# Patient Record
Sex: Male | Born: 1975 | Race: White | Hispanic: No | Marital: Married | Smoking: Never smoker
Health system: Southern US, Community
[De-identification: ages and names within clinical notes are randomized; demographics above are authoritative.]

## PROBLEM LIST (undated history)

## (undated) DIAGNOSIS — K219 Gastro-esophageal reflux disease without esophagitis: Secondary | ICD-10-CM

## (undated) DIAGNOSIS — L309 Dermatitis, unspecified: Secondary | ICD-10-CM

## (undated) HISTORY — DX: Dermatitis, unspecified: L30.9

## (undated) HISTORY — DX: Gastro-esophageal reflux disease without esophagitis: K21.9

## (undated) HISTORY — PX: WISDOM TOOTH EXTRACTION: SHX21

## (undated) HISTORY — PX: LIPOMA EXCISION: SHX5283

---

## 2015-11-25 ENCOUNTER — Ambulatory Visit
Admission: RE | Admit: 2015-11-25 | Discharge: 2015-11-25 | Disposition: A | Discharge status: Home / Self Care | Payer: Self-pay | Source: Ambulatory Visit | Attending: Sports Medicine | Admitting: Sports Medicine

## 2015-11-25 ENCOUNTER — Other Ambulatory Visit: Payer: Self-pay | Admitting: *Deleted

## 2015-11-25 DIAGNOSIS — R0781 Pleurodynia: Secondary | ICD-10-CM

## 2015-11-25 MED ORDER — IBUPROFEN 800 MG PO TABS
ORAL_TABLET | ORAL | Status: DC
Start: 2015-11-25 — End: 2019-02-23

## 2015-11-28 ENCOUNTER — Telehealth: Payer: Self-pay | Admitting: Sports Medicine
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
## 2016-09-07 ENCOUNTER — Other Ambulatory Visit: Payer: Self-pay | Admitting: Family Medicine

## 2016-09-07 DIAGNOSIS — R221 Localized swelling, mass and lump, neck: Secondary | ICD-10-CM

## 2016-09-20 ENCOUNTER — Ambulatory Visit
Admission: RE | Admit: 2016-09-20 | Discharge: 2016-09-20 | Disposition: A | Discharge status: Home / Self Care | Payer: BLUE CROSS/BLUE SHIELD | Source: Ambulatory Visit | Attending: Family Medicine | Admitting: Family Medicine

## 2016-09-20 DIAGNOSIS — R221 Localized swelling, mass and lump, neck: Secondary | ICD-10-CM

## 2016-09-20 MED ORDER — IOPAMIDOL (ISOVUE-300) INJECTION 61%: 75.0000 mL | Freq: Once | INTRAVENOUS | Status: DC | PRN

## 2016-12-05 ENCOUNTER — Ambulatory Visit (HOSPITAL_COMMUNITY)
Admission: EM | Admit: 2016-12-05 | Discharge: 2016-12-05 | Disposition: A | Discharge status: Home / Self Care | Payer: BLUE CROSS/BLUE SHIELD

## 2016-12-07 ENCOUNTER — Other Ambulatory Visit: Payer: Self-pay | Admitting: Family Medicine

## 2016-12-07 ENCOUNTER — Ambulatory Visit
Admission: RE | Admit: 2016-12-07 | Discharge: 2016-12-07 | Disposition: A | Discharge status: Home / Self Care | Payer: BLUE CROSS/BLUE SHIELD | Source: Ambulatory Visit | Attending: Family Medicine | Admitting: Family Medicine

## 2016-12-07 DIAGNOSIS — N23 Unspecified renal colic: Secondary | ICD-10-CM

## 2017-04-15 ENCOUNTER — Telehealth: Payer: Self-pay | Admitting: Sports Medicine

## 2017-04-15 ENCOUNTER — Other Ambulatory Visit: Payer: Self-pay | Admitting: *Deleted

## 2017-04-15 DIAGNOSIS — R599 Enlarged lymph nodes, unspecified: Secondary | ICD-10-CM

## 2017-04-15 NOTE — Telephone Encounter (Signed)
Please note that this is not a telephone note.  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 He is complaining of a lump on the right side of his neck. It has been present for several months. It is nonpainful. It has decreased some in size but is still noticeable. He denies fevers or chills. No weight loss. A CT scan with and without contrast done in January of this year showed multiple cervical lymph nodes bilaterally. No further workup or follow-up was recommended by the ordering physician per the patient's report.  Physical examination shows him to have a palpable immobile mass along the right side of his neck. It is nontender. No other masses are appreciated.  Impression: Cervical lymphadenopathy  Plan: This lymph node has been present for several months. Patient is concerned and would like to see a specialist. I recommend consultation with Dr. Narda Bonds. I'll defer further workup and treatment to the discretion of Dr. Ezzard Standing.

## 2017-04-15 NOTE — Progress Notes (Signed)
Patient ID: Shawn Fuentes, male   DOB: [birth year], 42 y.o.   MRN: [id number]  Appt with:   Dr Narda Bonds [address] [place] [place]  Phone: [phone]  Thursday 04/18/17 at 215p

## 2017-10-23 ENCOUNTER — Encounter: Payer: Self-pay | Admitting: *Deleted

## 2017-10-23 NOTE — Progress Notes (Signed)
Dr Narda Bondshris Newman Fri 10/25/17 at 215pm [address],  [place], [place] [address] [phone]

## 2017-12-05 ENCOUNTER — Encounter: Payer: Self-pay | Admitting: Sports Medicine

## 2017-12-05 ENCOUNTER — Ambulatory Visit (INDEPENDENT_AMBULATORY_CARE_PROVIDER_SITE_OTHER): Payer: BLUE CROSS/BLUE SHIELD | Admitting: Sports Medicine

## 2017-12-05 VITALS — BP 138/90 | Ht 68.0 in | Wt 232.0 lb

## 2017-12-05 DIAGNOSIS — R599 Enlarged lymph nodes, unspecified: Secondary | ICD-10-CM | POA: Diagnosis not present

## 2017-12-05 NOTE — Progress Notes (Signed)
   Subjective:    Patient ID: Shawn Fuentes, male    DOB: April 26, 1976, 42 y.o.   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 This has been ongoing for several months. He recently saw Dr. Ezzard Standing for concerns about a lump on the right side of his neck. Dr. Ezzard Standing reassured him that it was not cancerous but that he would be happy to surgically excise it if it was bothersome. Patient is planning on having that excised on April 25. His pain is mild and noticeable primarily with direct pressure over the palpable lump as well as with cervical rotation to the left. No fevers or chills. He brought some blood work results with him that are dated 02/13/2017. He would like to establish me as his primary care physician from this point going forward.  No significant past medical history other than a single episode of a renal calculus in May of last year He takes no chronic medications No known drug allergies Surgical history is significant for a recent basal cell carcinoma removal from his upper lip. He denies tobacco use, drinks alcohol on occasion Family history is negative for coronary artery disease or cancer. Both of his parents are alive and healthy.    Review of Systems As above    Objective:   Physical Exam  Well-developed, well-nourished. No acute distress. Awake alert and oriented 3. Vital signs reviewed. Blood pressure is 138/90  Neck: Small palpable mass along the right lateral neck. Minimally tender to palpation. It is somewhat mobile. No surrounding edema.  Laboratory data from 02/13/2017 including a lipid panel, TSH, and glucose are all within normal limits. A CBC and CMP done on 12/10/2016 were also within normal limits.      Assessment & Plan:   Right-sided neck mass, likely a small lymph node  Patient has been reassured that this is likely not cancer. However, it is certainly causing  him some discomfort and distress. Therefore, I recommend that he proceed with surgical excision as scheduled. I will check some follow-up blood work including a CBC and CMP today. Phone follow-up with those results when available. Patient will return to the office in June for a complete physical exam.

## 2017-12-06 LAB — COMPREHENSIVE METABOLIC PANEL
ALK PHOS: 84 IU/L (ref 39–117)
ALT: 23 IU/L (ref 0–44)
AST: 19 IU/L (ref 0–40)
Albumin/Globulin Ratio: 1.8 (ref 1.2–2.2)
Albumin: 4.6 g/dL (ref 3.5–5.5)
BILIRUBIN TOTAL: 0.6 mg/dL (ref 0.0–1.2)
BUN / CREAT RATIO: 13 (ref 9–20)
BUN: 12 mg/dL (ref 6–24)
CO2: 22 mmol/L (ref 20–29)
Calcium: 9.2 mg/dL (ref 8.7–10.2)
Chloride: 105 mmol/L (ref 96–106)
Creatinine, Ser: 0.92 mg/dL (ref 0.76–1.27)
GFR calc Af Amer: 119 mL/min/{1.73_m2} (ref >59)
GFR calc non Af Amer: 103 mL/min/{1.73_m2} (ref >59)
GLUCOSE: 91 mg/dL (ref 65–99)
Globulin, Total: 2.5 g/dL (ref 1.5–4.5)
Potassium: 4.8 mmol/L (ref 3.5–5.2)
Sodium: 141 mmol/L (ref 134–144)
Total Protein: 7.1 g/dL (ref 6.0–8.5)

## 2017-12-06 LAB — CBC
Hematocrit: 47.8 % (ref 37.5–51.0)
Hemoglobin: 16.6 g/dL (ref 13.0–17.7)
MCH: 30.2 pg (ref 26.6–33.0)
MCHC: 34.7 g/dL (ref 31.5–35.7)
MCV: 87 fL (ref 79–97)
PLATELETS: 288 10*3/uL (ref 150–379)
RBC: 5.5 x10E6/uL (ref 4.14–5.80)
RDW: 14 % (ref 12.3–15.4)
WBC: 6 10*3/uL (ref 3.4–10.8)

## 2017-12-16 ENCOUNTER — Telehealth: Payer: Self-pay | Admitting: Sports Medicine

## 2017-12-16 NOTE — Telephone Encounter (Signed)
Patient notified last week via telephone of normal blood work.

## 2017-12-19 ENCOUNTER — Other Ambulatory Visit: Payer: Self-pay | Admitting: Otolaryngology

## 2018-02-13 ENCOUNTER — Encounter: Payer: Self-pay | Admitting: Sports Medicine

## 2018-02-13 ENCOUNTER — Ambulatory Visit (INDEPENDENT_AMBULATORY_CARE_PROVIDER_SITE_OTHER): Payer: BLUE CROSS/BLUE SHIELD | Admitting: Sports Medicine

## 2018-02-13 VITALS — BP 116/88 | Ht 68.0 in | Wt 232.0 lb

## 2018-02-13 DIAGNOSIS — Z0000 Encounter for general adult medical examination without abnormal findings: Secondary | ICD-10-CM

## 2018-02-13 NOTE — Progress Notes (Signed)
   Subjective:    Patient ID: Shawn Fuentes, male    DOB: 23-May-1976, 42 y.o.   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 Only complaint is intermittent choking. He states that over the past 2 years he has had several episodes where he feels like he cannot catch his breath when eating or drinking. Most episodes start with swallowing. For instance, he states that once while drinking wine he felt his throat close off and he could not catch his breath. Episode lasted about 30 seconds. That was 2 years ago. Since then, he will have episodes that occur at random. He states that he "feels like I'm choking but I can't get my breath". One episode occurred in the evening after eating a large meal. He had some burning in his throat shortly after that.He has recently started Pepcid [facility] for reflux but he isn't sure if that helps. Each episode is quite embarrassing for him. He looks like he is choking when it happens. He has a history of reflux and has been on Prilosec in the past.  He takes no chronic medications Has no known drug allergies Recently had a lipoma removed from his neck. Also recently had surgery on his upper lip to remove skin cancer. 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    Review of Systems Denies chest pain, abdominal pain, lower abdominal swelling. Remainder of review of systems is as above    Objective:   Physical Exam Well developed, well-nourished. No acute distress. Awake alert and oriented 3. Vital signs reviewed  HEENT: Well-healed surgical incision along the right side of his neck consistent with his previous lipoma removal. No carotid bruits. Cardiovascular: Regular rate, no murmurs, rubs, or gallops. Lungs: Clear to auscultation bilaterally, no rhonchi, rales, or wheezes. Abdomen: Benign Extremities: No edema. Good pulses       Assessment  & Plan:   Aclasia versus GERD versus vocal cord dysfunction  CBC and CMP recently were unremarkable. We will check a fasting lipid profile today. Patient will discontinue Pepcid and start over-the-counter Prilosec daily for the next eight weeks. However, if he experiences another episode, then I would consider an initial referral to GI since his symptoms seem to be triggered by swallowing. Follow-up with me in 8 weeks for recheck. I will call him with the results of his fasting lipid profile when available.

## 2018-02-14 LAB — LIPID PANEL
Chol/HDL Ratio: 5.1 ratio — ABNORMAL HIGH (ref 0.0–5.0)
Cholesterol, Total: 197 mg/dL (ref 100–199)
HDL: 39 mg/dL — ABNORMAL LOW (ref >39)
LDL Calculated: 134 mg/dL — ABNORMAL HIGH (ref 0–99)
Triglycerides: 119 mg/dL (ref 0–149)
VLDL Cholesterol Cal: 24 mg/dL (ref 5–40)

## 2018-02-14 NOTE — Patient Instructions (Signed)
[facility] Dr Ewing SchleinMagod [address], [address] [place] [address] Thursday 02/20/18 @ 345p Arrival time is 330p [phone]

## 2018-02-20 ENCOUNTER — Other Ambulatory Visit: Payer: Self-pay | Admitting: Gastroenterology

## 2018-02-20 DIAGNOSIS — R1310 Dysphagia, unspecified: Secondary | ICD-10-CM

## 2018-02-25 ENCOUNTER — Telehealth: Payer: Self-pay | Admitting: Sports Medicine

## 2018-02-25 NOTE — Telephone Encounter (Signed)
I left a message on the patient's voicemail today regarding his recent cholesterol. LDL is elevated at 134. I've recommended lifestyle changes consisting of improved diet and exercise. We will recheck his cholesterol in 6 months. I also decided to refer the patient to GI for his dysphasia/ questionable acalasia. I reviewed the notes from Dr.Magod who has recommended daily omeprazole and scheduled a barium swallow. If that study is unremarkable, then the patient may need to return to ENT to rule out laryngeal spasm.

## 2018-03-03 ENCOUNTER — Ambulatory Visit
Admission: RE | Admit: 2018-03-03 | Discharge: 2018-03-03 | Disposition: A | Discharge status: Home / Self Care | Payer: BLUE CROSS/BLUE SHIELD | Source: Ambulatory Visit | Attending: Gastroenterology | Admitting: Gastroenterology

## 2018-03-03 DIAGNOSIS — R1310 Dysphagia, unspecified: Secondary | ICD-10-CM

## 2018-08-12 IMAGING — RF DG ESOPHAGUS
10 of 11 series · 14 of 24 positions shown · non-contrast
Comparison: None.

CLINICAL DATA: Esophageal dysphagia

EXAM:
ESOPHOGRAM / BARIUM SWALLOW / BARIUM TABLET STUDY
TECHNIQUE: Combined double contrast and single contrast examination performed
using effervescent crystals, thick barium liquid, and thin barium
liquid. The patient was observed with fluoroscopy swallowing a 13 mm
barium sulphate tablet.
FLUOROSCOPY TIME:  Fluoroscopy Time:  1 minutes and 54 seconds
Radiation Exposure Index (if provided by the fluoroscopic device):
126 mGy
Number of Acquired Spot Images: 0

[Series 1: sequence · 0.31mm/px · 2 of 15 frames shown (1 of 8)]
[frame 3/15]
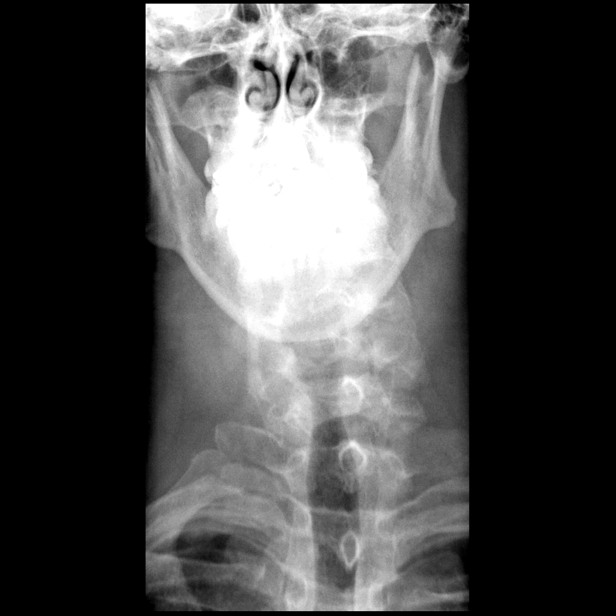
[frame 13/15]
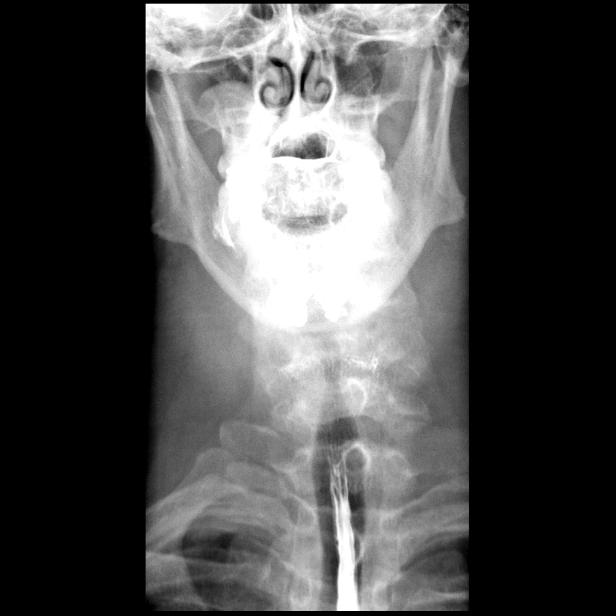

[Series 2: sequence · 0.31mm/px · 1 of 13 frames shown (2 of 8)]
[frame 11/13]
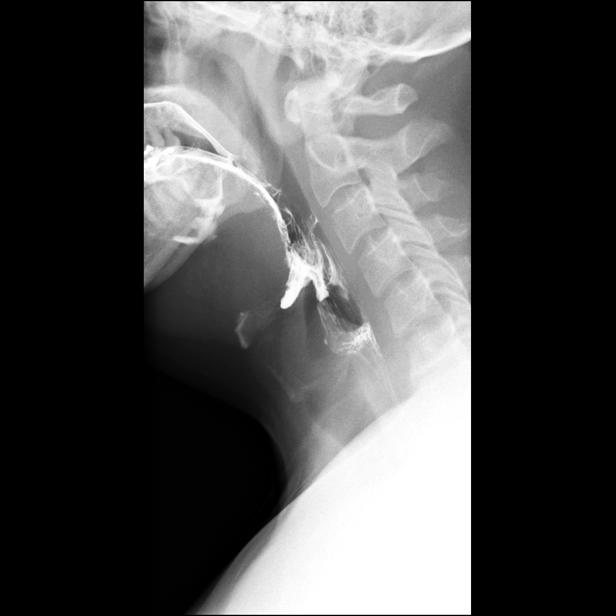

[Series 3: sequence · 0.28mm/px · 2 of 9 frames shown (3 of 8)]
[frame 5/9]
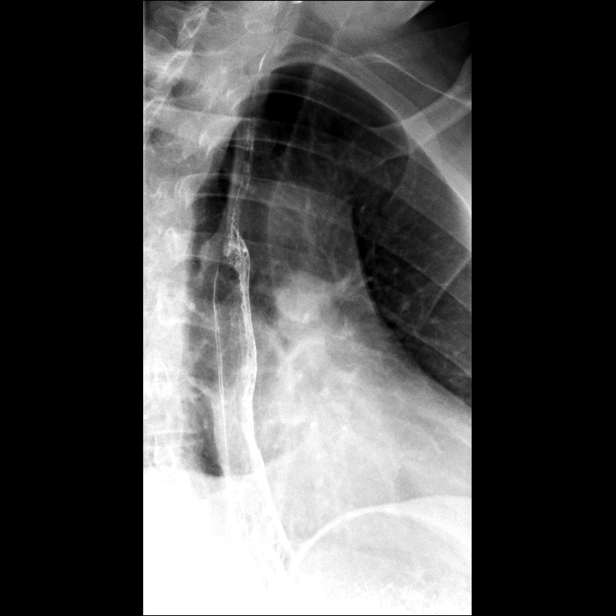
[frame 7/9]
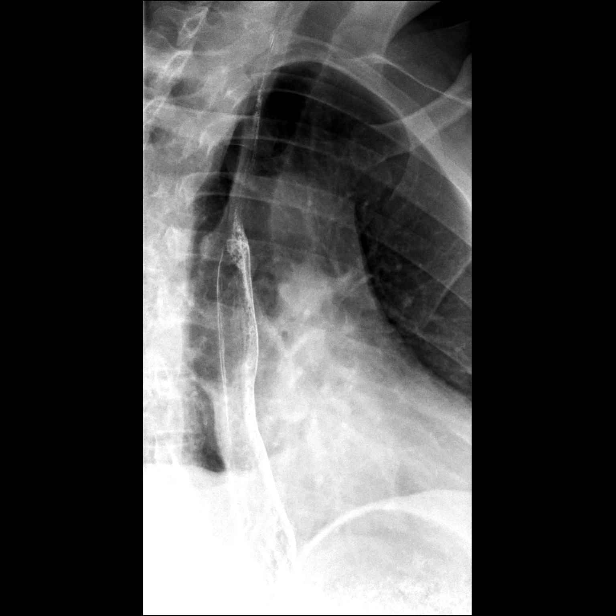

[Series 4: sequence · 1 of 9 frames shown (4 of 8)]
[frame 5/9]
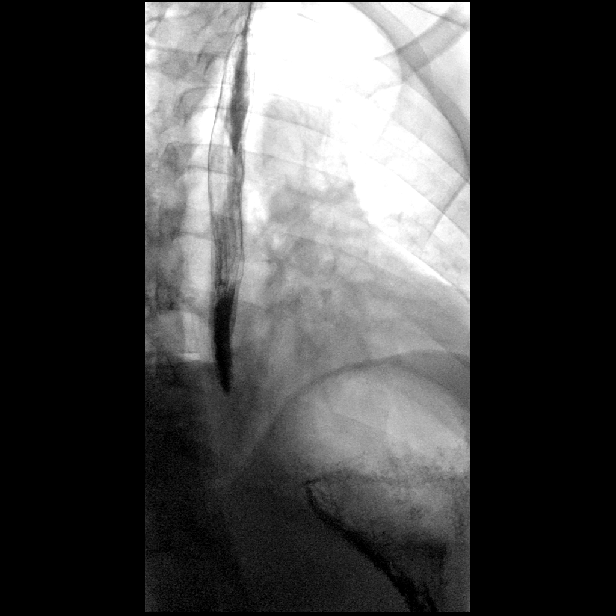

[Series 6: sequence · 2 of 14 frames shown (5 of 8)]
[frame 3/14]
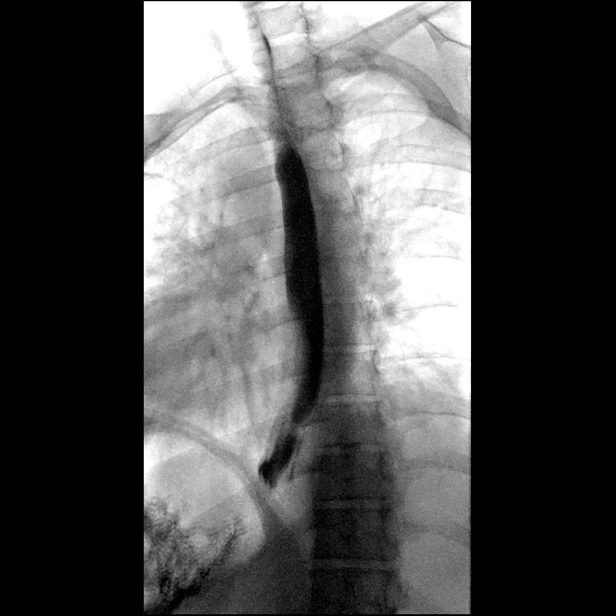
[frame 8/14]
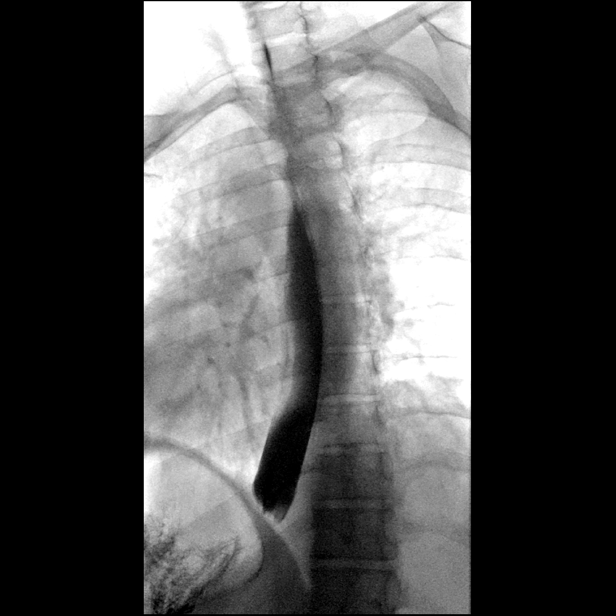

[Series 7: sequence · 1 of 19 frames shown (6 of 8)]
[frame 10/19]
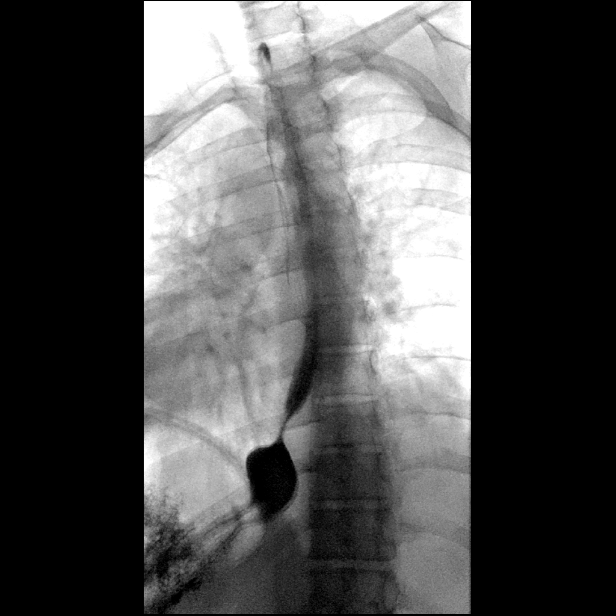

[Series 8: sequence · 2 of 17 frames shown (7 of 8)]
[frame 2/17]
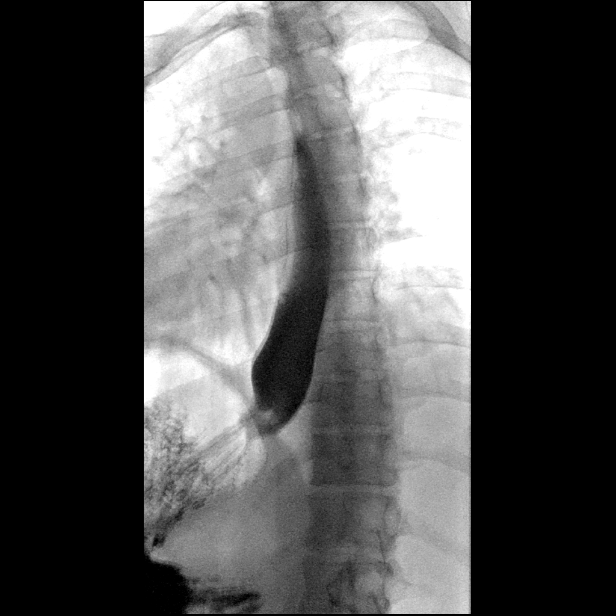
[frame 15/17]
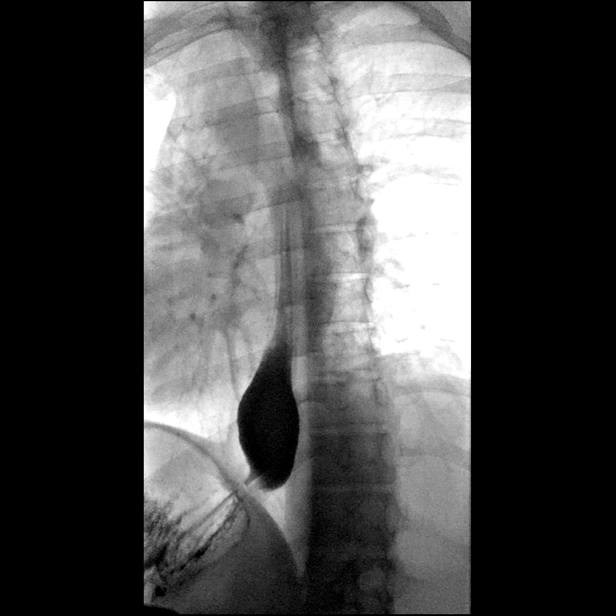

[Series 9: one shot · 1 of 2 slices shown (1 of 2)]
[im 1/2]
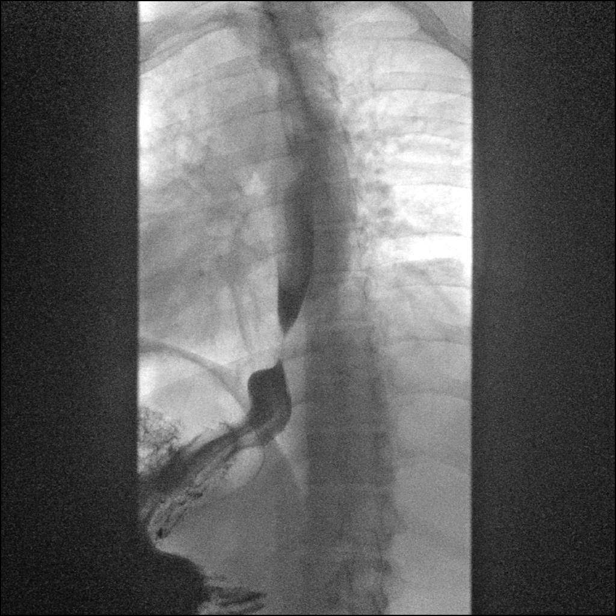

[Series 10: sequence · 1 of 15 frames shown (8 of 8)]
[frame 3/15]
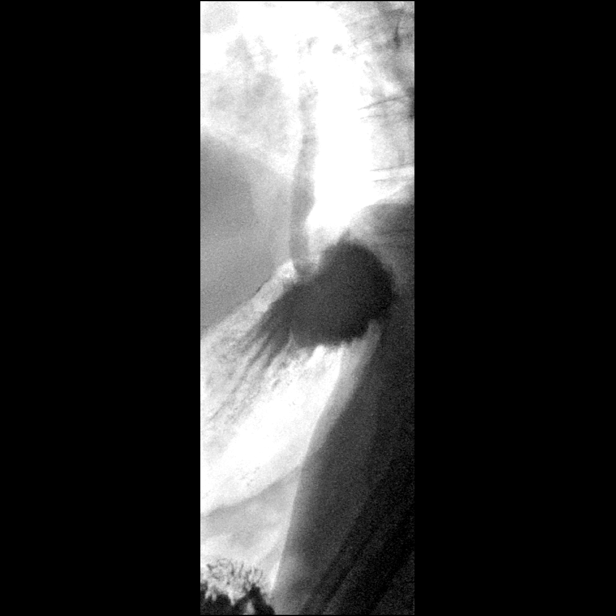

[Series 11: one shot · 1 of 1 slices shown (2 of 2)]
[im 1/1]
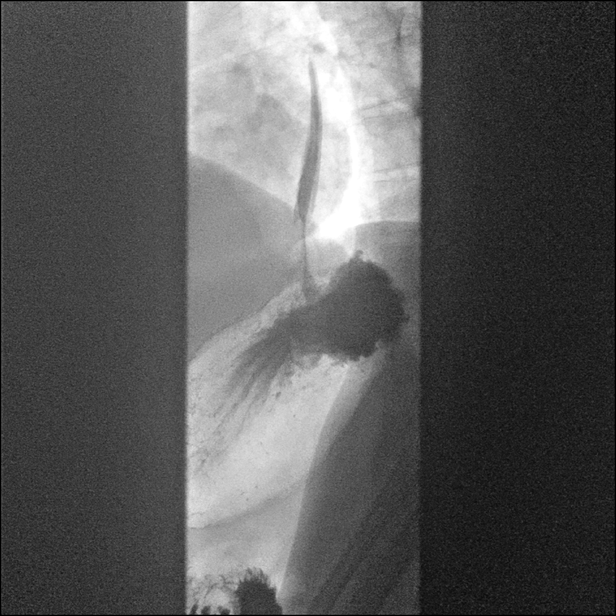

[14 of 24 positions shown; findings below may reference images not displayed]

FINDINGS: Initial barium swallows demonstrate normal pharyngeal motion with
swallowing. No laryngeal penetration or aspiration. No upper
esophageal webs, strictures or diverticuli.

Normal esophageal motility. No intrinsic or extrinsic lesions were
identified. No mucosal abnormalities. No esophageal mass or
stricture.

Very small intermittent sliding hiatal hernia with inducible GE
reflux with water swallowing.

The 13 mm barium pill passed into the stomach without difficulty.
IMPRESSION: 1. Normal esophageal motility.
2. Small sliding-type hiatal hernia with inducible GE reflux.
3. No esophageal mass or stricture.

## 2019-02-23 ENCOUNTER — Other Ambulatory Visit: Payer: Self-pay

## 2019-02-23 ENCOUNTER — Ambulatory Visit (INDEPENDENT_AMBULATORY_CARE_PROVIDER_SITE_OTHER): Payer: BC Managed Care – PPO | Admitting: Sports Medicine

## 2019-02-23 VITALS — BP 124/70 | Ht 68.0 in | Wt 230.0 lb

## 2019-02-23 DIAGNOSIS — Z0000 Encounter for general adult medical examination without abnormal findings: Secondary | ICD-10-CM | POA: Diagnosis not present

## 2019-02-23 NOTE — Progress Notes (Signed)
   Subjective:    Shawn Fuentes - 43 y.o. male MRN [id number]  Date of birth: 1976-01-31  CC:  Shawn Fuentes is here for his annual physical.  HPI: Shawn Fuentes reports that he is feeling well and has no complaints today.  Last year, he was referred to [facility] for work-up of the choking episodes he was having with swallowing, and he was placed on prescription strength Prilosec.  This medication was effective for him and eliminated his symptoms.  He stopped it in May and had rebound symptoms for about a week, but these resolved.  He is not taking any medications currently, other than occasional ketoconazole for tinea on his arms.  He had a basal cell skin cancer removed from his upper lip, which is healed well.  He was also placed on 5-fluorouracil for a precancerous lesion on his nose, which healed well.  He has another area on his nose that he is concerned may be developing into another skin cancer.  He has follow-up with dermatology in September.  Health Maintenance:  Health Maintenance Due  Topic Date Due  . HIV Screening  02/15/1991  . TETANUS/TDAP  02/15/1995    -  has no history on file for tobacco. - Review of Systems: Per HPI. - Past Medical History: Patient Active Problem List   Diagnosis Date Noted  . Annual physical exam 02/23/2019   - Medications: reviewed and updated   Objective:   Physical Exam BP 124/70   Ht 5\' 8"  (1.727 m)   Wt 230 lb (104.3 kg)   BMI 34.97 kg/m  Gen: NAD, alert, cooperative with exam, well-appearing, obese HEENT: NCAT, clear conjunctiva, supple neck CV: RRR, good S1/S2, no murmur, no edema, capillary refill brisk  Resp: CTABL, no wheezes, non-labored Abd: SNTND, BS present, no guarding or organomegaly Skin: no rashes, normal turgor, no lesions noted on patient's nose Neuro: no gross deficits.  Psych: good insight, alert and oriented        Assessment & Plan:   Annual physical exam Patient appears to be in good  health.  We will obtain a lipid panel today to follow-up on his elevated LDL cholesterol of 134 from 2019.  Patient was counseled that he may benefit from a statin in the future, but he can make this decision with Dr. Micheline Chapman.  He is not due for any other health maintenance items at the time.  We will see him in 1 year for his next annual physical or earlier if needed.    Maia Breslow, M.D. 02/23/2019, 9:28 AM PGY-2, [facility]  Patient seen and evaluated with the resident.  I agree with the above plan of care.  Fasting lipid profile was reviewed.  His triglycerides are elevated at 161.  They were 119 1 year ago.  His LDL has dropped slightly from 1 34-1 26 and his HDL is at 35.  I recommended lifestyle changes such as diet and exercise to help control his triglycerides.  I think his LDL is stable enough to continue watching but I would like to recheck another lipid profile in 6 months.

## 2019-02-23 NOTE — Assessment & Plan Note (Addendum)
Patient appears to be in good health.  We will obtain a lipid panel today to follow-up on his elevated LDL cholesterol of 134 from 2019.  Patient was counseled that he may benefit from a statin in the future, but he can make this decision with Dr. Micheline Chapman.  He is not due for any other health maintenance items at the time.  We will see him in 1 year for his next annual physical or earlier if needed.

## 2019-02-24 ENCOUNTER — Encounter: Payer: Self-pay | Admitting: Sports Medicine

## 2019-02-24 LAB — LIPID PANEL
Chol/HDL Ratio: 5.5 ratio — ABNORMAL HIGH (ref 0.0–5.0)
Cholesterol, Total: 193 mg/dL (ref 100–199)
HDL: 35 mg/dL — ABNORMAL LOW (ref >39)
LDL Calculated: 126 mg/dL — ABNORMAL HIGH (ref 0–99)
Triglycerides: 161 mg/dL — ABNORMAL HIGH (ref 0–149)
VLDL Cholesterol Cal: 32 mg/dL (ref 5–40)

## 2020-03-17 ENCOUNTER — Ambulatory Visit (INDEPENDENT_AMBULATORY_CARE_PROVIDER_SITE_OTHER): Payer: BLUE CROSS/BLUE SHIELD | Admitting: Sports Medicine

## 2020-03-17 ENCOUNTER — Other Ambulatory Visit: Payer: Self-pay

## 2020-03-17 VITALS — BP 120/84 | Ht 68.0 in | Wt 235.0 lb

## 2020-03-17 DIAGNOSIS — Z0000 Encounter for general adult medical examination without abnormal findings: Secondary | ICD-10-CM

## 2020-03-17 NOTE — Progress Notes (Addendum)
   Subjective:    Patient ID: Shawn Fuentes, male    DOB: Aug 18, 1976, 44 y.o.   MRN: [id number]  HPI   Patient is here for his annual physical exam.  He is doing well.  He voices no complaints.  Has a history of reflux disease as well as hyperlipidemia.  No chest pain or shortness of breath.   Review of Systems As above    Objective:   Physical Exam  Well-developed, well-nourished.  No acute distress.  Vital signs reviewed  HEENT: Normocephalic atraumatic.  Pupils equal round and reactive to light and accommodation. Neck: Supple.  No JVD, no lymphadenopathy.  No carotid bruits. Cardiovascular: Regular rate.  No murmurs, rubs, or gallops. Lungs: Clear to auscultation bilaterally.  No rhonchi, rales, or wheezes. Abdomen: Benign Extremities: No edema      Assessment & Plan:   44 year old male with history of reflux disease and hyperlipidemia  Patient is doing very well with his reflux disease.  His triglycerides were elevated last year at 161.  We will plan on rechecking a fasting lipid profile today in addition to a CBC and CMP.  Phone follow-up with those results when available.  Addendum: Labs reviewed.  LDL remains elevated at 139.  Triglycerides are back to within normal limits.  Remainder of his blood work is normal.  I once again encouraged lifestyle changes such as eating more fruits and vegetables and starting a light exercise program.  We should recheck this again in 6 months.  If LDL remains elevated despite lifestyle changes then we may need to consider medication.

## 2020-03-18 LAB — LIPID PANEL
Chol/HDL Ratio: 4.9 ratio (ref 0.0–5.0)
Cholesterol, Total: 204 mg/dL — ABNORMAL HIGH (ref 100–199)
HDL: 42 mg/dL (ref >39)
LDL Chol Calc (NIH): 139 mg/dL — ABNORMAL HIGH (ref 0–99)
Triglycerides: 125 mg/dL (ref 0–149)
VLDL Cholesterol Cal: 23 mg/dL (ref 5–40)

## 2020-03-18 LAB — CBC
Hematocrit: 51.2 % — ABNORMAL HIGH (ref 37.5–51.0)
Hemoglobin: 17.2 g/dL (ref 13.0–17.7)
MCH: 28.8 pg (ref 26.6–33.0)
MCHC: 33.6 g/dL (ref 31.5–35.7)
MCV: 86 fL (ref 79–97)
Platelets: 300 10*3/uL (ref 150–450)
RBC: 5.97 x10E6/uL — ABNORMAL HIGH (ref 4.14–5.80)
RDW: 13.3 % (ref 11.6–15.4)
WBC: 7.4 10*3/uL (ref 3.4–10.8)

## 2020-03-18 LAB — COMPREHENSIVE METABOLIC PANEL
ALT: 40 IU/L (ref 0–44)
AST: 20 IU/L (ref 0–40)
Albumin/Globulin Ratio: 1.7 (ref 1.2–2.2)
Albumin: 4.7 g/dL (ref 4.0–5.0)
Alkaline Phosphatase: 88 IU/L (ref 48–121)
BUN/Creatinine Ratio: 14 (ref 9–20)
BUN: 17 mg/dL (ref 6–24)
Bilirubin Total: 0.6 mg/dL (ref 0.0–1.2)
CO2: 23 mmol/L (ref 20–29)
Calcium: 9.6 mg/dL (ref 8.7–10.2)
Chloride: 104 mmol/L (ref 96–106)
Creatinine, Ser: 1.18 mg/dL (ref 0.76–1.27)
GFR calc Af Amer: 86 mL/min/{1.73_m2} (ref >59)
GFR calc non Af Amer: 75 mL/min/{1.73_m2} (ref >59)
Globulin, Total: 2.8 g/dL (ref 1.5–4.5)
Glucose: 92 mg/dL (ref 65–99)
Potassium: 4.5 mmol/L (ref 3.5–5.2)
Sodium: 140 mmol/L (ref 134–144)
Total Protein: 7.5 g/dL (ref 6.0–8.5)

## 2020-05-17 ENCOUNTER — Ambulatory Visit (INDEPENDENT_AMBULATORY_CARE_PROVIDER_SITE_OTHER): Payer: BLUE CROSS/BLUE SHIELD

## 2020-05-17 ENCOUNTER — Ambulatory Visit (INDEPENDENT_AMBULATORY_CARE_PROVIDER_SITE_OTHER): Payer: BLUE CROSS/BLUE SHIELD | Admitting: Sports Medicine

## 2020-05-17 ENCOUNTER — Other Ambulatory Visit: Payer: Self-pay

## 2020-05-17 VITALS — BP 124/82 | Ht 68.0 in | Wt 228.0 lb

## 2020-05-17 DIAGNOSIS — Z111 Encounter for screening for respiratory tuberculosis: Secondary | ICD-10-CM

## 2020-05-17 DIAGNOSIS — Z0000 Encounter for general adult medical examination without abnormal findings: Secondary | ICD-10-CM

## 2020-05-17 NOTE — Progress Notes (Signed)
Patient presents in nurse clinic for PPD placement. PPD placed in left ventral forearm. Patient to return on 09/23 at noon for read.

## 2020-05-17 NOTE — Progress Notes (Signed)
Patient ID: Shawn Fuentes, male   DOB: Sep 26, 1975, 44 y.o.   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  His last complete physical was here at our office back in July.  Form will be completed and available for review through Epic.

## 2020-05-19 ENCOUNTER — Ambulatory Visit: Payer: BLUE CROSS/BLUE SHIELD

## 2020-05-19 DIAGNOSIS — Z111 Encounter for screening for respiratory tuberculosis: Secondary | ICD-10-CM

## 2020-05-19 LAB — TB SKIN TEST
Induration: 0 mm
TB Skin Test: NEGATIVE

## 2020-05-19 NOTE — Progress Notes (Signed)
Patient had TB test read at [facility].  Per [place], TB skin test negative.  Results recorded in epic.

## 2021-03-21 ENCOUNTER — Ambulatory Visit (INDEPENDENT_AMBULATORY_CARE_PROVIDER_SITE_OTHER): Payer: BLUE CROSS/BLUE SHIELD | Admitting: Sports Medicine

## 2021-03-21 ENCOUNTER — Other Ambulatory Visit: Payer: Self-pay

## 2021-03-21 VITALS — BP 128/90 | Ht 68.0 in | Wt 235.0 lb

## 2021-03-21 DIAGNOSIS — Z0000 Encounter for general adult medical examination without abnormal findings: Secondary | ICD-10-CM

## 2021-03-21 DIAGNOSIS — E785 Hyperlipidemia, unspecified: Secondary | ICD-10-CM

## 2021-03-21 NOTE — Progress Notes (Addendum)
   Subjective:    Patient ID: Shawn Fuentes, male    DOB: 05-Aug-1976, 45 y.o.   MRN: [id number]  HPI chief complaint: Annual physical   Shawn Fuentes presents today for his annual physical.  He is doing well.  No complaints.  He did have COVID earlier this year but his symptoms were primarily upper respiratory.  He denies any current shortness of breath.  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  His reflux is under good control.  Cholesterol was slightly elevated at last years physical.  No chronic medications No known drug allergies    Review of Systems As above    Objective:   Physical Exam  Well-developed, well-nourished.  No acute distress.  Blood pressure 128/90   Cardiovascular: Regular rate.  No murmurs, rubs, or gallops.  No carotid bruits Lungs: Clear to auscultation bilaterally.  No rhonchi's, rales, or wheezes. Abdomen: Soft, nontender, nondistended.  Positive bowel sounds Extremities: No edema      Assessment & Plan:   Healthy 45 year old male  His cholesterol was slightly elevated last year so we will check a new fasting lipid profile today.  I will call him with those results.  Addendum: Labs reviewed.  LDL is still slightly elevated at 124 but improved from a year ago (139).  I have encouraged him to continue with diet and exercise.  Total cholesterol and triglycerides are within normal limits.  Patient notified of these results.

## 2021-03-22 LAB — LIPID PANEL
Chol/HDL Ratio: 4.7 ratio (ref 0.0–5.0)
Cholesterol, Total: 188 mg/dL (ref 100–199)
HDL: 40 mg/dL (ref >39)
LDL Chol Calc (NIH): 124 mg/dL — ABNORMAL HIGH (ref 0–99)
Triglycerides: 131 mg/dL (ref 0–149)
VLDL Cholesterol Cal: 24 mg/dL (ref 5–40)

## 2021-07-10 ENCOUNTER — Ambulatory Visit
Admission: EM | Admit: 2021-07-10 | Discharge: 2021-07-10 | Disposition: A | Discharge status: Home / Self Care | Payer: BLUE CROSS/BLUE SHIELD | Attending: Emergency Medicine | Admitting: Emergency Medicine

## 2021-07-10 ENCOUNTER — Other Ambulatory Visit: Payer: Self-pay

## 2021-07-10 DIAGNOSIS — R519 Headache, unspecified: Secondary | ICD-10-CM | POA: Diagnosis not present

## 2021-07-10 DIAGNOSIS — R000 Tachycardia, unspecified: Secondary | ICD-10-CM

## 2021-07-10 DIAGNOSIS — R059 Cough, unspecified: Secondary | ICD-10-CM | POA: Diagnosis not present

## 2021-07-10 DIAGNOSIS — J101 Influenza due to other identified influenza virus with other respiratory manifestations: Secondary | ICD-10-CM | POA: Diagnosis not present

## 2021-07-10 LAB — POCT INFLUENZA A/B
Influenza A, POC: POSITIVE — AB
Influenza B, POC: NEGATIVE

## 2021-07-10 MED ORDER — OSELTAMIVIR PHOSPHATE 75 MG PO CAPS: 75.0000 mg | ORAL_CAPSULE | Freq: Two times a day (BID) | ORAL | 0 refills | Status: DC

## 2021-07-10 MED ORDER — ALBUTEROL SULFATE HFA 108 (90 BASE) MCG/ACT IN AERS: 2.0000 | INHALATION_SPRAY | Freq: Four times a day (QID) | RESPIRATORY_TRACT | 0 refills | Status: DC | PRN

## 2021-07-10 MED ORDER — PROMETHAZINE-DM 6.25-15 MG/5ML PO SYRP: 5.0000 mL | ORAL_SOLUTION | Freq: Four times a day (QID) | ORAL | 0 refills | Status: AC | PRN

## 2021-07-10 MED ORDER — AEROCHAMBER PLUS FLO-VU LARGE MISC: 1.0000 | Freq: Once | 0 refills | Status: AC

## 2021-07-10 NOTE — ED Triage Notes (Signed)
Pt took at home covid test that was negative yesterday. Pt reports having cough and headache since yesterday.

## 2021-07-10 NOTE — ED Provider Notes (Signed)
[facility]    CSN: [id number] Arrival date & time: 07/10/21  1436   History   Chief Complaint Chief Complaint  Patient presents with   Cough   Headache   HPI Shawn Fuentes is a 45 y.o. male. Pt took at home covid test that was negative yesterday. Pt reports having cough and headache since yesterday.  Rapid flu test today was positive.  The history is provided by the patient and a parent.   History reviewed. No pertinent past medical history. Patient Active Problem List   Diagnosis Date Noted   Annual physical exam 02/23/2019   History reviewed. No pertinent surgical history.  Home Medications    Prior to Admission medications   Medication Sig Start Date End Date Taking? Authorizing Provider  albuterol (VENTOLIN HFA) 108 (90 Base) MCG/ACT inhaler Inhale 2 puffs into the lungs every 6 (six) hours as needed for wheezing or shortness of breath (Cough). 07/10/21  Yes Theadora Rama Scales, PA-C  oseltamivir (TAMIFLU) 75 MG capsule Take 1 capsule (75 mg total) by mouth every 12 (twelve) hours. 07/10/21  Yes Theadora Rama Scales, PA-C  promethazine-dextromethorphan (PROMETHAZINE-DM) 6.25-15 MG/5ML syrup Take 5 mLs by mouth 4 (four) times daily as needed for up to 7 days for cough. 07/10/21 07/17/21 Yes Theadora Rama Scales, PA-C  ketoconazole (NIZORAL) 2 % cream APPLY TO AFFECTED AREA TWICE A DAY 10/29/17   [provider]   Family History History reviewed. No pertinent family history. Social History Social History   Tobacco Use   Smoking status: Never   Smokeless tobacco: Never  Vaping Use   Vaping Use: Never used  Substance Use Topics   Alcohol use: Yes   Drug use: Never   Allergies   Patient has no known allergies.  Review of Systems Review of Systems Pertinent findings noted in history of present illness.   Physical Exam Triage Vital Signs ED Triage Vitals  Enc Vitals Group     BP 06/23/21 0827 (!) 147/82     Pulse Rate 06/23/21  0827 72     Resp 06/23/21 0827 18     Temp 06/23/21 0827 98.3 F (36.8 C)     Temp Source 06/23/21 0827 Oral     SpO2 06/23/21 0827 98 %     Weight --      Height --      Head Circumference --      Peak Flow --      Pain Score 06/23/21 0826 5     Pain Loc --      Pain Edu? --      Excl. in GC? --    No data found.  Updated Vital Signs BP 119/86 (BP Location: Left Arm)   Pulse (!) 110   Temp 99.5 F (37.5 C) (Oral)   Resp 18   SpO2 94%   Visual Acuity Right Eye Distance:   Left Eye Distance:   Bilateral Distance:    Right Eye Near:   Left Eye Near:    Bilateral Near:     Physical Exam Vitals and nursing note reviewed.  Constitutional:      General: He is not in acute distress.    Appearance: Normal appearance. He is not ill-appearing.  HENT:     Head: Normocephalic and atraumatic.     Salivary Glands: Right salivary gland is not diffusely enlarged or tender. Left salivary gland is not diffusely enlarged or tender.     Right Ear: Tympanic membrane, ear canal  and external ear normal. No drainage. No middle ear effusion. There is no impacted cerumen. Tympanic membrane is not erythematous or bulging.     Left Ear: Tympanic membrane, ear canal and external ear normal. No drainage.  No middle ear effusion. There is no impacted cerumen. Tympanic membrane is not erythematous or bulging.     Nose: Nose normal. No nasal deformity, septal deviation, mucosal edema, congestion or rhinorrhea.     Right Turbinates: Not enlarged, swollen or pale.     Left Turbinates: Not enlarged, swollen or pale.     Right Sinus: No maxillary sinus tenderness or frontal sinus tenderness.     Left Sinus: No maxillary sinus tenderness or frontal sinus tenderness.     Mouth/Throat:     Lips: Pink. No lesions.     Mouth: Mucous membranes are moist. No oral lesions.     Pharynx: Oropharynx is clear. Uvula midline. No posterior oropharyngeal erythema or uvula swelling.     Tonsils: No tonsillar  exudate. 0 on the right. 0 on the left.  Eyes:     General: Lids are normal.        Right eye: No discharge.        Left eye: No discharge.     Extraocular Movements: Extraocular movements intact.     Conjunctiva/sclera: Conjunctivae normal.     Right eye: Right conjunctiva is not injected.     Left eye: Left conjunctiva is not injected.  Neck:     Trachea: Trachea and phonation normal.  Cardiovascular:     Rate and Rhythm: Normal rate and regular rhythm.     Pulses: Normal pulses.     Heart sounds: Normal heart sounds. No murmur heard.   No friction rub. No gallop.  Pulmonary:     Effort: Pulmonary effort is normal. No accessory muscle usage, prolonged expiration or respiratory distress.     Breath sounds: Normal breath sounds. No stridor, decreased air movement or transmitted upper airway sounds. No decreased breath sounds, wheezing, rhonchi or rales.  Chest:     Chest wall: No tenderness.  Musculoskeletal:        General: Normal range of motion.     Cervical back: Normal range of motion and neck supple. Normal range of motion.  Lymphadenopathy:     Cervical: No cervical adenopathy.  Skin:    General: Skin is warm and dry.     Findings: No erythema or rash.  Neurological:     General: No focal deficit present.     Mental Status: He is alert and oriented to person, place, and time.  Psychiatric:        Mood and Affect: Mood normal.        Behavior: Behavior normal.   UC Treatments / Results  Labs (all labs ordered are listed, but only abnormal results are displayed)  Labs Reviewed  POCT INFLUENZA A/B - Abnormal; Notable for the following components:      Result Value   Influenza A, POC Positive (*)    All other components within normal limits    EKG  Radiology No results found.  Procedures Procedures (including critical care time)  Medications Ordered in UC Medications - No data to display  Initial Impression / Assessment and Plan / UC Course  I have  reviewed the triage vital signs and the nursing notes.  Pertinent labs & imaging results that were available during my care of the patient were reviewed by me and considered in my  medical decision making (see chart for details).      Rapid flu test today is positive for influenza A.  Tamiflu prescription provided, conservative care recommended.  Return precautions advised. Final Clinical Impressions(s) / UC Diagnoses   Final diagnoses:  Acute nonintractable headache, unspecified headache type  Cough, unspecified type  Tachycardia  Influenza A     Discharge Instructions      You tested positive for influenza A.  Conservative care is recommended at this time.  This includes rest, pushing clear fluids and activity as tolerated.  You may also noticed that your appetite is reduced, this is okay as long as they are drinking plenty of clear fluids.  To improve your work of breathing, I have prescribed you an albuterol inhaler, please inhale 2 puffs up to 4 times daily for cough, chest tightness, any sensation of shortness of breath.  Please begin Tamiflu, 1 tablet twice daily for the next 5 days to reduce your risk of hospitalization secondary to complications from influenza as well as to shorten the duration and severity of your infection.  Acetaminophen (Tylenol): This is a good fever reducer.  If there body temperature rises above 101.5 as measured with a thermometer, it is recommended that you give them 1,000 mg every 6-8 hours until they are temperature falls below 101.5, please not take more than 3,000 mg of acetaminophen either as a separate medication or as in ingredient in an over-the-counter cold/flu preparation within a 24-hour period  Ibuprofen  (Advil, Motrin): This is a good anti-inflammatory medication which addresses aches and pains and, to some degree, congestion in the nasal passages.  I recommend giving between 400 to 600 mg every 6-8 hours as needed.  Pseudoephedrine  (Sudafed): This is a decongestant.  This medication has to be purchased from the pharmacist counter, I recommend giving 2 tablets, 60 mg, 2-3 times a day as needed to relieve runny nose and sinus drainage.  Guaifenesin (Robitussin, Mucinex): This is an expectorant.  This helps break up chest congestion and loosen up thick nasal drainage making phlegm and drainage more liquid and therefore easier to remove.  I recommend being 400 mg three times daily as needed.  Dextromethorphan (any cough medicine with the letters "DM" added to it's name such as Robitussin DM): This is a cough suppressant.  This is often recommended to be taken at nighttime to suppress cough and help children sleep.  Give dosage as directed on the bottle.   Chloraseptic Throat Spray: Spray 5 sprays into affected area every 2 hours, hold for 15 seconds and either swallow or spit it out.  This is a excellent numbing medication because it is a spray, you can put it right where you needed and so sucking on a lozenge and numbing your entire mouth.  Based on my physical exam findings and the history provided  today, I do not see any evidence of bacterial infection therefore treatment with antibiotics would be of no benefit.  Please follow-up within the next 3 to 5 days either with your primary care provider or urgent care if their symptoms do not resolve.  If you do not have a primary care provider, we will assist you in finding one.      ED Prescriptions     Medication Sig Dispense Auth. Provider   oseltamivir (TAMIFLU) 75 MG capsule Take 1 capsule (75 mg total) by mouth every 12 (twelve) hours. 10 capsule Theadora Rama Scales, PA-C   albuterol (VENTOLIN HFA) 108 (90 Base)  MCG/ACT inhaler Inhale 2 puffs into the lungs every 6 (six) hours as needed for wheezing or shortness of breath (Cough). 18 g Theadora Rama Scales, PA-C   Spacer/Aero-Holding Chambers (AEROCHAMBER PLUS FLO-VU LARGE) MISC 1 each by Other route once for 1 dose. 1  each Theadora Rama Scales, PA-C   promethazine-dextromethorphan (PROMETHAZINE-DM) 6.25-15 MG/5ML syrup Take 5 mLs by mouth 4 (four) times daily as needed for up to 7 days for cough. 118 mL Theadora Rama Scales, PA-C      PDMP not reviewed this encounter.  Disposition Upon Discharge:  Patient presented with an acute illness with associated systemic symptoms and significant discomfort requiring urgent management. In my opinion, this is a condition that a prudent lay person (someone who possesses an average knowledge of health and medicine) may potentially expect to result in complications if not addressed urgently such as respiratory distress, impairment of bodily function or dysfunction of bodily organs.   Routine symptom specific, illness specific and/or disease specific instructions were discussed with the patient and/or caregiver at length.   As such, the patient has been evaluated and assessed, work-up was performed and treatment was provided in alignment with urgent care protocols and evidence based medicine.  Patient/parent/caregiver has been advised that the patient may require follow up for further testing and treatment if the symptoms continue in spite of treatment, as clinically indicated and appropriate.  Patient was tested for COVID-19, Influenza and/or RSV, the patient/parent/guardian was advised to isolate at home pending the results of his/her diagnostic coronavirus test and potentially longer if they're positive. Advised pt that if his/her COVID-19 test returns positive, it's recommended to self-isolate for at least 10 days after symptoms first appeared AND until fever-free for 24 hours without fever reducer AND other symptoms have improved or resolved. Discussed self-isolation recommendations as well as instructions for household member/close contacts as per the [facility] and [place] DHHS, and also gave patient the COVID packet with this information.  Patient/parent/caregiver has been  advised to return to the [facility] or PCP in 3-5 days if no better; to PCP or the Emergency Department if new signs and symptoms develop, or if the current signs or symptoms continue to change or worsen for further workup, evaluation and treatment as clinically indicated and appropriate  The patient will follow up with their current PCP if and as advised. If the patient does not currently have a PCP we will assist them in obtaining one.   The patient may need specialty follow up if the symptoms continue, in spite of conservative treatment and management, for further workup, evaluation, consultation and treatment as clinically indicated and appropriate.  Patient/parent/caregiver verbalized understanding and agreement of plan as discussed.  All questions were addressed during visit.  Please see discharge instructions below for further details of plan.  Condition: stable for discharge home Home: take medications as prescribed; routine discharge instructions as discussed; follow up as advised.    Theadora Rama Scales, PA-C 07/11/21 1322

## 2021-07-10 NOTE — Discharge Instructions (Signed)
You tested positive for influenza A.  Conservative care is recommended at this time.  This includes rest, pushing clear fluids and activity as tolerated.  You may also noticed that your appetite is reduced, this is okay as long as they are drinking plenty of clear fluids.  To improve your work of breathing, I have prescribed you an albuterol inhaler, please inhale 2 puffs up to 4 times daily for cough, chest tightness, any sensation of shortness of breath.  Please begin Tamiflu, 1 tablet twice daily for the next 5 days to reduce your risk of hospitalization secondary to complications from influenza as well as to shorten the duration and severity of your infection.  Acetaminophen (Tylenol): This is a good fever reducer.  If there body temperature rises above 101.5 as measured with a thermometer, it is recommended that you give them 1,000 mg every 6-8 hours until they are temperature falls below 101.5, please not take more than 3,000 mg of acetaminophen either as a separate medication or as in ingredient in an over-the-counter cold/flu preparation within a 24-hour period  Ibuprofen  (Advil, Motrin): This is a good anti-inflammatory medication which addresses aches and pains and, to some degree, congestion in the nasal passages.  I recommend giving between 400 to 600 mg every 6-8 hours as needed.  Pseudoephedrine (Sudafed): This is a decongestant.  This medication has to be purchased from the pharmacist counter, I recommend giving 2 tablets, 60 mg, 2-3 times a day as needed to relieve runny nose and sinus drainage.  Guaifenesin (Robitussin, Mucinex): This is an expectorant.  This helps break up chest congestion and loosen up thick nasal drainage making phlegm and drainage more liquid and therefore easier to remove.  I recommend being 400 mg three times daily as needed.  Dextromethorphan (any cough medicine with the letters "DM" added to it's name such as Robitussin DM): This is a cough suppressant.  This  is often recommended to be taken at nighttime to suppress cough and help children sleep.  Give dosage as directed on the bottle.   Chloraseptic Throat Spray: Spray 5 sprays into affected area every 2 hours, hold for 15 seconds and either swallow or spit it out.  This is a excellent numbing medication because it is a spray, you can put it right where you needed and so sucking on a lozenge and numbing your entire mouth.  Based on my physical exam findings and the history provided  today, I do not see any evidence of bacterial infection therefore treatment with antibiotics would be of no benefit.  Please follow-up within the next 3 to 5 days either with your primary care provider or urgent care if their symptoms do not resolve.  If you do not have a primary care provider, we will assist you in finding one.

## 2022-01-29 ENCOUNTER — Other Ambulatory Visit: Payer: Self-pay

## 2022-01-29 DIAGNOSIS — Z0000 Encounter for general adult medical examination without abnormal findings: Secondary | ICD-10-CM

## 2022-02-01 ENCOUNTER — Ambulatory Visit (INDEPENDENT_AMBULATORY_CARE_PROVIDER_SITE_OTHER): Payer: BC Managed Care – PPO | Admitting: Sports Medicine

## 2022-02-01 VITALS — BP 124/88 | Ht 68.0 in | Wt 235.0 lb

## 2022-02-01 DIAGNOSIS — Z0000 Encounter for general adult medical examination without abnormal findings: Secondary | ICD-10-CM

## 2022-02-01 NOTE — Patient Instructions (Signed)
Dr. Leone Payor or Dr. Nanda [facility] [address] [place] [address] [place], [place] [address] [phone]

## 2022-02-01 NOTE — Progress Notes (Signed)
   Subjective:    Patient ID: Shawn Fuentes, male    DOB: [birth year], 46 y.o.   MRN: [id number]  HPI chief complaint: Annual physical  Shawn Fuentes is a 46 year old male that presents today for his annual physical.  He is doing well.  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  He is excited about this new challenge.  He voices no complaints today.  He is otherwise healthy other than some elevated cholesterol in the past. Takes no chronic medications No known drug allergies Non-smoker Family history is negative for any sort of cancer.   Review of Systems As above    Objective:   Physical Exam  Well-developed, well-nourished.  No acute distress  Blood pressure is 124/88.  Weight is 235 (BMI 35.73)  HEENT: Normocephalic, atraumatic.  Tympanic membranes are intact and clear bilaterally.  Throat is clear without exudate Neck: Supple.  No JVD.  No lymphadenopathy.  No carotid bruits. Cardiovascular: Regular rate.  No murmurs, rubs, or gallops. Lungs: Clear to auscultation bilaterally.  No rhonchi's, rales, or wheezes. Abdomen: Benign Extremities: No edema      Assessment & Plan:   Healthy 46 year old male  Blood work done earlier today is pending.  I will follow-up with him with the results of his CBC, CMP, and fasting lipid profile when available.  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 Shawn Fuentes will also contact gastroenterology to schedule his screening colonoscopy.

## 2022-02-02 LAB — CBC
Hematocrit: 51.5 % — ABNORMAL HIGH (ref 37.5–51.0)
Hemoglobin: 17.6 g/dL (ref 13.0–17.7)
MCH: 29.2 pg (ref 26.6–33.0)
MCHC: 34.2 g/dL (ref 31.5–35.7)
MCV: 86 fL (ref 79–97)
Platelets: 295 10*3/uL (ref 150–450)
RBC: 6.02 x10E6/uL — ABNORMAL HIGH (ref 4.14–5.80)
RDW: 12.9 % (ref 11.6–15.4)
WBC: 6.2 10*3/uL (ref 3.4–10.8)

## 2022-02-02 LAB — COMPREHENSIVE METABOLIC PANEL
ALT: 34 IU/L (ref 0–44)
AST: 24 IU/L (ref 0–40)
Albumin/Globulin Ratio: 1.8 (ref 1.2–2.2)
Albumin: 4.7 g/dL (ref 4.0–5.0)
Alkaline Phosphatase: 86 IU/L (ref 44–121)
BUN/Creatinine Ratio: 16 (ref 9–20)
BUN: 16 mg/dL (ref 6–24)
Bilirubin Total: 0.8 mg/dL (ref 0.0–1.2)
CO2: 21 mmol/L (ref 20–29)
Calcium: 9.7 mg/dL (ref 8.7–10.2)
Chloride: 101 mmol/L (ref 96–106)
Creatinine, Ser: 1.02 mg/dL (ref 0.76–1.27)
Globulin, Total: 2.6 g/dL (ref 1.5–4.5)
Glucose: 94 mg/dL (ref 70–99)
Potassium: 4.4 mmol/L (ref 3.5–5.2)
Sodium: 138 mmol/L (ref 134–144)
Total Protein: 7.3 g/dL (ref 6.0–8.5)
eGFR: 92 mL/min/{1.73_m2} (ref >59)

## 2022-02-02 LAB — LIPID PANEL
Chol/HDL Ratio: 5 ratio (ref 0.0–5.0)
Cholesterol, Total: 210 mg/dL — ABNORMAL HIGH (ref 100–199)
HDL: 42 mg/dL (ref >39)
LDL Chol Calc (NIH): 146 mg/dL — ABNORMAL HIGH (ref 0–99)
Triglycerides: 121 mg/dL (ref 0–149)
VLDL Cholesterol Cal: 22 mg/dL (ref 5–40)

## 2022-02-06 ENCOUNTER — Telehealth: Payer: Self-pay | Admitting: Sports Medicine

## 2022-02-06 NOTE — Telephone Encounter (Signed)
  Shawn Fuentes was made aware yesterday of his lab results.  His total cholesterol and LDL are elevated.  LDL is 146.  He is going to try to get this down with diet and exercise.  We will plan on rechecking this in 3 to 6 months.  Remainder of his blood work was unremarkable.

## 2023-02-05 ENCOUNTER — Ambulatory Visit (INDEPENDENT_AMBULATORY_CARE_PROVIDER_SITE_OTHER): Payer: BC Managed Care – PPO | Admitting: Sports Medicine

## 2023-02-05 VITALS — BP 138/84 | Ht 68.0 in | Wt 230.0 lb

## 2023-02-05 DIAGNOSIS — Z0000 Encounter for general adult medical examination without abnormal findings: Secondary | ICD-10-CM

## 2023-02-05 DIAGNOSIS — E785 Hyperlipidemia, unspecified: Secondary | ICD-10-CM

## 2023-02-06 NOTE — Progress Notes (Signed)
   Subjective:    Patient ID: Shawn Fuentes, male    DOB: July 29, 1976, 47 y.o.   MRN: [id number]  HPI chief complaint: Complete physical  Shawn Fuentes presents today for his annual physical.  He is without complaint today.  Blood work last year showed some hypercholesterolemia and I recommended that we repeat his cholesterol 3 to 6 months from the time of that blood draw in June 2023.  Unfortunately, this was not done.  He has otherwise been healthy.    Review of Systems As above    Objective:   Physical Exam  Well-developed, well-nourished.  No acute distress.  Blood pressure is 138/84.  BMI is 35  HEENT: Normocephalic atraumatic.  Pupils equal.  Extraocular movement is intact. Neck: Supple: No JVD.  No lymphadenopathy. Cardiovascular: Regular rate.  No murmurs, rubs, or gallops Lungs: Clear to auscultation bilaterally.  No rhonchi's, rales, or wheezes. Abdomen: Benign Extremities: No edema      Assessment & Plan:   History of hypercholesterolemia  Will get updated blood work including a CBC, CMP, and fasting lipid profile.  Will follow-up with him with those results when available.  We will also refer him to GI to discuss screening colonoscopy.  This note was dictated using Dragon naturally speaking software and may contain errors in syntax, spelling, or content which have not been identified prior to signing this note.

## 2023-02-09 LAB — LIPID PANEL W/O CHOL/HDL RATIO
Cholesterol, Total: 192 mg/dL (ref 100–199)
HDL: 43 mg/dL (ref >39)
LDL Chol Calc (NIH): 130 mg/dL — ABNORMAL HIGH (ref 0–99)
Triglycerides: 104 mg/dL (ref 0–149)
VLDL Cholesterol Cal: 19 mg/dL (ref 5–40)

## 2023-02-09 LAB — COMPREHENSIVE METABOLIC PANEL
ALT: 37 IU/L (ref 0–44)
AST: 23 IU/L (ref 0–40)
Albumin/Globulin Ratio: 1.9
Albumin: 4.7 g/dL (ref 4.1–5.1)
Alkaline Phosphatase: 90 IU/L (ref 44–121)
BUN/Creatinine Ratio: 13 (ref 9–20)
BUN: 14 mg/dL (ref 6–24)
Bilirubin Total: 1 mg/dL (ref 0.0–1.2)
CO2: 22 mmol/L (ref 20–29)
Calcium: 9.4 mg/dL (ref 8.7–10.2)
Chloride: 102 mmol/L (ref 96–106)
Creatinine, Ser: 1.07 mg/dL (ref 0.76–1.27)
Globulin, Total: 2.5 g/dL (ref 1.5–4.5)
Glucose: 99 mg/dL (ref 70–99)
Potassium: 4.5 mmol/L (ref 3.5–5.2)
Sodium: 139 mmol/L (ref 134–144)
Total Protein: 7.2 g/dL (ref 6.0–8.5)
eGFR: 87 mL/min/{1.73_m2} (ref >59)

## 2023-02-09 LAB — CBC
Hematocrit: 51 % (ref 37.5–51.0)
Hemoglobin: 17.2 g/dL (ref 13.0–17.7)
MCH: 29.2 pg (ref 26.6–33.0)
MCHC: 33.7 g/dL (ref 31.5–35.7)
MCV: 87 fL (ref 79–97)
Platelets: 301 10*3/uL (ref 150–450)
RBC: 5.89 x10E6/uL — ABNORMAL HIGH (ref 4.14–5.80)
RDW: 13.1 % (ref 11.6–15.4)
WBC: 5.8 10*3/uL (ref 3.4–10.8)

## 2023-02-13 ENCOUNTER — Encounter: Payer: Self-pay | Admitting: Gastroenterology

## 2023-02-18 ENCOUNTER — Telehealth: Payer: Self-pay | Admitting: Sports Medicine

## 2023-02-18 NOTE — Telephone Encounter (Signed)
Patient notified today of his lab results.  Labs all look good with the exception of his LDL which is elevated at 130 but down from last year (146).  I encouraged him to try to control this with diet and exercise prior to starting medication.

## 2023-03-06 ENCOUNTER — Ambulatory Visit (AMBULATORY_SURGERY_CENTER): Payer: BC Managed Care – PPO | Admitting: *Deleted

## 2023-03-06 VITALS — Ht 68.0 in | Wt 235.0 lb

## 2023-03-06 DIAGNOSIS — Z1211 Encounter for screening for malignant neoplasm of colon: Secondary | ICD-10-CM

## 2023-03-06 MED ORDER — NA SULFATE-K SULFATE-MG SULF 17.5-3.13-1.6 GM/177ML PO SOLN: 1.0000 | Freq: Once | ORAL | 0 refills | Status: AC

## 2023-03-06 NOTE — Progress Notes (Signed)
Pt's previsit is done over the phone and all paperwork (prep instructions) sent to patient.  Pt's name and DOB verified at the beginning of the previsit.  Pt denies any difficulty with ambulating.    No egg or soy allergy known to patient  No issues known to pt with past sedation with any surgeries or procedures Patient denies trouble moving neck  No FH of Malignant Hyperthermia Pt is not on diet pills Pt is not on  home 02  Pt is not on blood thinners  Pt denies issues with constipation  Pt is not on dialysis Pt denies any upcoming cardiac testing Pt encouraged to use to use Singlecare or Goodrx to reduce cost  Patient's chart reviewed by John Nulty CNRA prior to previsit and patient appropriate for the [facility].  Previsit completed and red dot placed by patient's name on their procedure day (on provider's schedule).    

## 2023-03-07 ENCOUNTER — Ambulatory Visit: Payer: BC Managed Care – PPO | Admitting: Family Medicine

## 2023-03-07 ENCOUNTER — Encounter: Payer: Self-pay | Admitting: Gastroenterology

## 2023-03-25 ENCOUNTER — Encounter: Payer: BC Managed Care – PPO | Admitting: Gastroenterology

## 2023-03-27 ENCOUNTER — Ambulatory Visit: Payer: BC Managed Care – PPO | Admitting: Family Medicine

## 2023-03-27 ENCOUNTER — Encounter: Payer: Self-pay | Admitting: Family Medicine

## 2023-03-27 VITALS — BP 118/72 | HR 103 | Temp 99.1°F | Ht 68.0 in | Wt 234.0 lb

## 2023-03-27 DIAGNOSIS — Z1211 Encounter for screening for malignant neoplasm of colon: Secondary | ICD-10-CM | POA: Diagnosis not present

## 2023-03-27 DIAGNOSIS — E785 Hyperlipidemia, unspecified: Secondary | ICD-10-CM | POA: Diagnosis not present

## 2023-03-27 DIAGNOSIS — Z87898 Personal history of other specified conditions: Secondary | ICD-10-CM

## 2023-03-27 NOTE — Progress Notes (Signed)
Subjective:  Patient ID: Shawn Fuentes, male    DOB: 19-Oct-1975  Age: 47 y.o. MRN: [id number]  CC:  Chief Complaint  Patient presents with   New Patient (Initial Visit)    Pt is recommended by Dr Margaretha Sheffield to come over here     HPI Shawn Fuentes (pronounced resevic) presents for   New patient to  establish care, previous primary care provider Dr. Margaretha Sheffield with [facility]. Recent annual physical exam was 02/05/2023 with Dr. Margaretha Sheffield.  Prior patient of Dr. Hyacinth Meeker prior.   Surgical hx: Benign cyst/lesion removed few years ago at ENT.   Derm: Dr. Emily Filbert. 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   Football Psychologist, occupational at [employer], prior Psychologist, occupational at [employer].  Has children that are rising Senior, Printmaker and 8th grader.    Hyperlipidemia: History of mild hyperlipidemia previously.  Repeat labs noted from June 14 with elevated LDL of 130.  This has improved from previous lipids in June 2023 with total cholesterol 210, LDL 146 at that time. Weight was stable at that time at 235, 234 today, 230 at his physical in June. No meds.  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  No FH of early CAD known. Mom passed at 100. Father living at 51yo.  Wt Readings from Last 3 Encounters:  03/27/23 234 lb (106.1 kg)  03/06/23 235 lb (106.6 kg)  02/05/23 230 lb (104.3 kg)    The 10-year ASCVD risk score (Arnett DK, et al., 2019) is: 2.5%   Values used to calculate the score:     Age: 79 years     Sex: Male     Is Non-Hispanic African American: No     Diabetic: No     Tobacco smoker: No     Systolic Blood Pressure: 118 mmHg     Is BP treated: No     HDL Cholesterol: 43 mg/dL     Total Cholesterol: 192 mg/dL  Lab Results  Component Value Date   CHOL 192 02/08/2023   HDL 43 02/08/2023   LDLCALC 130 (H) 02/08/2023   TRIG 104 02/08/2023    CHOLHDL 5.0 02/01/2022   Lab Results  Component Value Date   ALT 37 02/08/2023   AST 23 02/08/2023   ALKPHOS 90 02/08/2023   BILITOT 1.0 02/08/2023   Health maintenance Planned colonoscopy, previsit completed on July 10. Plans in January due to insurance and copays.  No personal hx of polyps or bleeding, no FH of colon CA. Would like to try Cologuard.  Screening options with colonoscopy versus Cologuard discussed. Discussed timing of repeat testing intervals if normal, as well as potential need for diagnostic Colonoscopy if positive Cologuard. Understanding expressed, and chose Cologuard.   No rx meds.  Hx of heartburn with extra weight, not when weight is below 240.   Immunization History  Administered Date(s) Administered   PPD Test 05/17/2020  Recommended tetanus.  Covid vaccine - declines.     History Patient Active Problem List   Diagnosis Date Noted   Annual physical exam 02/23/2019   Past Medical History:  Diagnosis Date   Eczema    GERD (gastroesophageal reflux disease)    Past Surgical History:  Procedure Laterality Date   LIPOMA EXCISION     on neck   WISDOM TOOTH EXTRACTION     No Known Allergies Prior  to Admission medications   Medication Sig Start Date End Date Taking? Authorizing Provider  clobetasol ointment (TEMOVATE) 0.05 % as needed. 09/06/21  Yes [provider]  ketoconazole (NIZORAL) 2 % cream APPLY TO AFFECTED AREA TWICE A DAY 10/29/17  Yes [provider]  albuterol (VENTOLIN HFA) 108 (90 Base) MCG/ACT inhaler Inhale 2 puffs into the lungs every 6 (six) hours as needed for wheezing or shortness of breath (Cough). Patient not taking: Reported on 03/27/2023 07/10/21   Theadora Rama Scales, PA-C  fluorouracil (EFUDEX) 5 % cream PRN Patient not taking: Reported on 03/27/2023 04/28/20   [provider]  oseltamivir (TAMIFLU) 75 MG capsule Take 1 capsule (75 mg total) by mouth every 12 (twelve) hours. Patient not taking:  Reported on 03/27/2023 07/10/21   Theadora Rama Scales, PA-C   Social History   Socioeconomic History   Marital status: Married    Spouse name: Not on file   Number of children: Not on file   Years of education: Not on file   Highest education level: Not on file  Occupational History   Not on file  Tobacco Use   Smoking status: Never   Smokeless tobacco: Never  Vaping Use   Vaping status: Never Used  Substance and Sexual Activity   Alcohol use: Yes    Comment: occ   Drug use: Never   Sexual activity: Yes  Other Topics Concern   Not on file  Social History Narrative   Not on file   Social Determinants of Health   Financial Resource Strain: Not on file  Food Insecurity: Not on file  Transportation Needs: Not on file  Physical Activity: Not on file  Stress: Not on file  Social Connections: Not on file  Intimate Partner Violence: Not on file    Review of Systems  Per HPI Objective:   Vitals:   03/27/23 1607  BP: 118/72  Pulse: (!) 103  Temp: 99.1 F (37.3 C)  TempSrc: Temporal  SpO2: 97%  Weight: 234 lb (106.1 kg)  Height: 5\' 8"  (1.727 m)     Physical Exam Vitals reviewed.  Constitutional:      Appearance: He is well-developed.  HENT:     Head: Normocephalic and atraumatic.  Neck:     Vascular: No carotid bruit or JVD.  Cardiovascular:     Rate and Rhythm: Normal rate and regular rhythm.     Heart sounds: Normal heart sounds. No murmur heard. Pulmonary:     Effort: Pulmonary effort is normal.     Breath sounds: Normal breath sounds. No rales.  Musculoskeletal:     Right lower leg: No edema.     Left lower leg: No edema.  Skin:    General: Skin is warm and dry.  Neurological:     Mental Status: He is alert and oriented to person, place, and time.  Psychiatric:        Mood and Affect: Mood normal.        Assessment & Plan:  Shawn Fuentes is a 47 y.o. male . Hyperlipidemia, unspecified hyperlipidemia type  -Low ASCVD risk score  without known early family history of coronary disease.  Continue diet/exercise approach with recheck levels in 1 year.  Commended on improvements.  Screen for colon cancer - Plan: Cologuard  -Initially planned colonoscopy but due to cost and no family history of colon cancer, average risk, he would like to try Cologuard first.  Limitations and benefits of both discussed as above.  Cologuard  ordered.  History of heartburn Stable, weight dependent and food dependent, RTC precautions if recurrent or present without typical trigger foods.  1 year follow-up for physical.  No orders of the defined types were placed in this encounter.  Patient Instructions  Thanks for coming in to see me this week.  I did order the Cologuard as we discussed.  You should receive that in the mail in the next week or two.  No new meds at this time but can recheck cholesterol levels at your next visit.  Continue to watch diet, exercise.  If heartburn returns, especially if having symptoms without typical trigger foods, let me know.  Follow-up in 1 year for a physical but I am happy to see you sooner if needed for any acute issues.  Take care.  Not currently on MyChart but he does plan to activate, patient instructions provided to review once he has activated MyChart but discussed above information in office.   Signed,   Meredith Staggers, MD [facility], [facility] 03/27/23 4:10 PM

## 2023-03-29 NOTE — Patient Instructions (Addendum)
Thanks for coming in to see me this week.  I did order the Cologuard as we discussed.  You should receive that in the mail in the next week or two.  No new meds at this time but can recheck cholesterol levels at your next visit.  Continue to watch diet, exercise.  If heartburn returns, especially if having symptoms without typical trigger foods, let me know.  Follow-up in 1 year for a physical but I am happy to see you sooner if needed for any acute issues.  Take care.

## 2023-04-16 LAB — COLOGUARD: COLOGUARD: NEGATIVE

## 2023-04-18 ENCOUNTER — Telehealth: Payer: Self-pay

## 2023-04-18 ENCOUNTER — Telehealth: Payer: Self-pay | Admitting: Family Medicine

## 2023-04-18 NOTE — Telephone Encounter (Signed)
Pt has been notified.

## 2023-04-18 NOTE — Telephone Encounter (Signed)
Pt returned your call.  

## 2023-04-18 NOTE — Telephone Encounter (Signed)
-----   Message from Shade Flood sent at 04/17/2023  6:13 PM EDT ----- Good news.  Cologuard colon cancer screening test was negative or normal.  This test should be repeated in 3 years.  Please let me know if he has questions.

## 2023-08-23 NOTE — Telephone Encounter (Signed)
error 

## 2024-01-17 ENCOUNTER — Ambulatory Visit (INDEPENDENT_AMBULATORY_CARE_PROVIDER_SITE_OTHER): Admitting: Family Medicine

## 2024-01-17 ENCOUNTER — Encounter: Payer: Self-pay | Admitting: Family Medicine

## 2024-01-17 VITALS — BP 126/70 | HR 68 | Temp 98.0°F | Resp 16 | Ht 68.5 in | Wt 233.0 lb

## 2024-01-17 DIAGNOSIS — Z130 Encounter for screening for diseases of the blood and blood-forming organs and certain disorders involving the immune mechanism: Secondary | ICD-10-CM

## 2024-01-17 DIAGNOSIS — Z0000 Encounter for general adult medical examination without abnormal findings: Secondary | ICD-10-CM

## 2024-01-17 DIAGNOSIS — E7800 Pure hypercholesterolemia, unspecified: Secondary | ICD-10-CM | POA: Diagnosis not present

## 2024-01-17 DIAGNOSIS — Z85828 Personal history of other malignant neoplasm of skin: Secondary | ICD-10-CM | POA: Insufficient documentation

## 2024-01-17 DIAGNOSIS — Z1329 Encounter for screening for other suspected endocrine disorder: Secondary | ICD-10-CM

## 2024-01-17 DIAGNOSIS — L821 Other seborrheic keratosis: Secondary | ICD-10-CM | POA: Insufficient documentation

## 2024-01-17 DIAGNOSIS — D225 Melanocytic nevi of trunk: Secondary | ICD-10-CM | POA: Insufficient documentation

## 2024-01-17 DIAGNOSIS — D2370 Other benign neoplasm of skin of unspecified lower limb, including hip: Secondary | ICD-10-CM | POA: Insufficient documentation

## 2024-01-17 DIAGNOSIS — Z87898 Personal history of other specified conditions: Secondary | ICD-10-CM | POA: Insufficient documentation

## 2024-01-17 DIAGNOSIS — Z131 Encounter for screening for diabetes mellitus: Secondary | ICD-10-CM | POA: Diagnosis not present

## 2024-01-17 LAB — CBC
HCT: 49 % (ref 39.0–52.0)
Hemoglobin: 17 g/dL (ref 13.0–17.0)
MCHC: 34.6 g/dL (ref 30.0–36.0)
MCV: 86.3 fl (ref 78.0–100.0)
Platelets: 309 10*3/uL (ref 150.0–400.0)
RBC: 5.68 Mil/uL (ref 4.22–5.81)
RDW: 13.7 % (ref 11.5–15.5)
WBC: 6.3 10*3/uL (ref 4.0–10.5)

## 2024-01-17 LAB — COMPREHENSIVE METABOLIC PANEL WITH GFR
ALT: 33 U/L (ref 0–53)
AST: 23 U/L (ref 0–37)
Albumin: 4.8 g/dL (ref 3.5–5.2)
Alkaline Phosphatase: 72 U/L (ref 39–117)
BUN: 18 mg/dL (ref 6–23)
CO2: 26 meq/L (ref 19–32)
Calcium: 9.8 mg/dL (ref 8.4–10.5)
Chloride: 103 meq/L (ref 96–112)
Creatinine, Ser: 0.95 mg/dL (ref 0.40–1.50)
GFR: 95.04 mL/min (ref >60.00)
Glucose, Bld: 87 mg/dL (ref 70–99)
Potassium: 4.3 meq/L (ref 3.5–5.1)
Sodium: 137 meq/L (ref 135–145)
Total Bilirubin: 0.7 mg/dL (ref 0.2–1.2)
Total Protein: 7.8 g/dL (ref 6.0–8.3)

## 2024-01-17 LAB — LIPID PANEL
Cholesterol: 202 mg/dL — ABNORMAL HIGH (ref 0–200)
HDL: 43.6 mg/dL (ref >39.00)
LDL Cholesterol: 138 mg/dL — ABNORMAL HIGH (ref 0–99)
NonHDL: 158.38
Total CHOL/HDL Ratio: 5
Triglycerides: 101 mg/dL (ref 0.0–149.0)
VLDL: 20.2 mg/dL (ref 0.0–40.0)

## 2024-01-17 LAB — TSH: TSH: 1.71 u[IU]/mL (ref 0.35–5.50)

## 2024-01-17 LAB — HEMOGLOBIN A1C: Hgb A1c MFr Bld: 5.4 % (ref 4.6–6.5)

## 2024-01-17 NOTE — Patient Instructions (Signed)
 Thanks for coming in today.  If any concerns on labs I will let you know.  I am not concerned with the slight elevated LDL especially with a low 13-YQMV heart disease risk score.  Continue to stay active.  We can keep an eye on that level once a year.  Keep follow-up with dermatology as planned.  Over-the-counter Pepcid or omeprazole as needed for heartburn but let me know if you require this meds frequently.  Try to avoid the typical trigger foods, late-night eating, etc.  Let me know if there are any questions, otherwise I will see you in a year for a physical.  Have a great summer.  Preventive Care 52-33 Years Old, Male Preventive care refers to lifestyle choices and visits with your health care provider that can promote health and wellness. Preventive care visits are also called wellness exams. What can I expect for my preventive care visit? Counseling During your preventive care visit, your health care provider may ask about your: Medical history, including: Past medical problems. Family medical history. Current health, including: Emotional well-being. Home life and relationship well-being. Sexual activity. Lifestyle, including: Alcohol, nicotine or tobacco, and drug use. Access to firearms. Diet, exercise, and sleep habits. Safety issues such as seatbelt and bike helmet use. Sunscreen use. Work and work Astronomer. Physical exam Your health care provider will check your: Height and weight. These may be used to calculate your BMI (body mass index). BMI is a measurement that tells if you are at a healthy weight. Waist circumference. This measures the distance around your waistline. This measurement also tells if you are at a healthy weight and may help predict your risk of certain diseases, such as type 2 diabetes and high blood pressure. Heart rate and blood pressure. Body temperature. Skin for abnormal spots. What immunizations do I need?  Vaccines are usually given at  various ages, according to a schedule. Your health care provider will recommend vaccines for you based on your age, medical history, and lifestyle or other factors, such as travel or where you work. What tests do I need? Screening Your health care provider may recommend screening tests for certain conditions. This may include: Lipid and cholesterol levels. Diabetes screening. This is done by checking your blood sugar (glucose) after you have not eaten for a while (fasting). Hepatitis B test. Hepatitis C test. HIV (human immunodeficiency virus) test. STI (sexually transmitted infection) testing, if you are at risk. Lung cancer screening. Prostate cancer screening. Colorectal cancer screening. Talk with your health care provider about your test results, treatment options, and if necessary, the need for more tests. Follow these instructions at home: Eating and drinking  Eat a diet that includes fresh fruits and vegetables, whole grains, lean protein, and low-fat dairy products. Take vitamin and mineral supplements as recommended by your health care provider. Do not drink alcohol if your health care provider tells you not to drink. If you drink alcohol: Limit how much you have to 0-2 drinks a day. Know how much alcohol is in your drink. In the [place] one drink equals one 12 oz bottle of beer (355 mL), one 5 oz glass of wine (148 mL), or one 1 oz glass of hard liquor (44 mL). Lifestyle Brush your teeth every morning and night with fluoride toothpaste. Floss one time each day. Exercise for at least 30 minutes 5 or more days each week. Do not use any products that contain nicotine or tobacco. These products include cigarettes, chewing tobacco, and vaping  devices, such as e-cigarettes. If you need help quitting, ask your health care provider. Do not use drugs. If you are sexually active, practice safe sex. Use a condom or other form of protection to prevent STIs. Take aspirin only as told by  your health care provider. Make sure that you understand how much to take and what form to take. Work with your health care provider to find out whether it is safe and beneficial for you to take aspirin daily. Find healthy ways to manage stress, such as: Meditation, yoga, or listening to music. Journaling. Talking to a trusted person. Spending time with friends and family. Minimize exposure to UV radiation to reduce your risk of skin cancer. Safety Always wear your seat belt while driving or riding in a vehicle. Do not drive: If you have been drinking alcohol. Do not ride with someone who has been drinking. When you are tired or distracted. While texting. If you have been using any mind-altering substances or drugs. Wear a helmet and other protective equipment during sports activities. If you have firearms in your house, make sure you follow all gun safety procedures. What's next? Go to your health care provider once a year for an annual wellness visit. Ask your health care provider how often you should have your eyes and teeth checked. Stay up to date on all vaccines. This information is not intended to replace advice given to you by your health care provider. Make sure you discuss any questions you have with your health care provider. Document Revised: 02/08/2021 Document Reviewed: 02/08/2021 Elsevier Patient Education  2024 [employer].

## 2024-01-17 NOTE — Progress Notes (Signed)
 Subjective:  Patient ID: Shawn Fuentes, male    DOB: 24-Sep-1975  Age: 48 y.o. MRN: [id number]  CC:  Chief Complaint  Patient presents with   Annual Exam    Pt is doing well, no questions, fasting     HPI Shawn Fuentes presents for Annual Exam Prior PCP Dr. Lovenia Ruby, established with me in July of last year. Football coach, [employer]. Oldest daughter is graduating this year - will be studying fermentation science at [employer].   Dtr. is freshman and eighth-grade son.   Derm: Dr. Joanne Muckle.   No health changes.   Hyperlipidemia: History of slight elevated LDL, noted by previous PCP.  No current meds.  Diet/exercise approach planned.  Low ASCVD risk score, no family history of early cardiac disease.  Last year his LDL had improved from a LDL of 146 in 2023.  Improved during the summers with workouts with coaching. Slight elevated RBC on labs previously, normal hematocrit in June 2024, slight elevation previously. Normal CMP last year. Has lost 8# recently with football practice.   Wt Readings from Last 3 Encounters:  01/17/24 233 lb (105.7 kg)  03/27/23 234 lb (106.1 kg)  03/06/23 235 lb (106.6 kg)    The 10-year ASCVD risk score (Arnett DK, et al., 2019) is: 2.8%   Values used to calculate the score:     Age: 48 years     Sex: Male     Is Non-Hispanic African American: No     Diabetic: No     Tobacco smoker: No     Systolic Blood Pressure: 126 mmHg     Is BP treated: No     HDL Cholesterol: 43 mg/dL     Total Cholesterol: 192 mg/dL  Lab Results  Component Value Date   CHOL 192 02/08/2023   HDL 43 02/08/2023   LDLCALC 130 (H) 02/08/2023   TRIG 104 02/08/2023   CHOLHDL 5.0 02/01/2022   Lab Results  Component Value Date   ALT 37 02/08/2023   AST 23 02/08/2023   ALKPHOS 90 02/08/2023   BILITOT 1.0 02/08/2023        01/17/2024    8:43 AM 03/27/2023    4:01 PM  Depression screen PHQ 2/9  Decreased Interest 0 0  Down, Depressed, Hopeless 0 0  PHQ  - 2 Score 0 0  Altered sleeping 0 0  Tired, decreased energy 0 0  Change in appetite 0 0  Feeling bad or failure about yourself  0 0  Trouble concentrating 0 0  Moving slowly or fidgety/restless 0 0  Suicidal thoughts 0 0  PHQ-9 Score 0 0  Difficult doing work/chores Not difficult at all     Health Maintenance  Topic Date Due   COVID-19 Vaccine (1) Never done   HIV Screening  Never done   Hepatitis C Screening  Never done   DTaP/Tdap/Td (1 - Tdap) Never done   INFLUENZA VACCINE  03/27/2024   Fecal DNA (Cologuard)  04/11/2026   HPV VACCINES  Aged Out   Meningococcal B Vaccine  Aged Out  Colon cancer screening discussed previously, initial plan for colonoscopy, then Cologuard chosen.  Negative on 04/12/2023. Prostate: does not have family history of prostate cancer The natural history of prostate cancer and ongoing controversy regarding screening and potential treatment outcomes of prostate cancer has been discussed with the patient. The meaning of a false positive PSA and a false negative PSA has been discussed. He indicates understanding of the limitations of  this screening test and wishes to defer testing for now.  Dermatology, Dr. Pecola Bouquet, prior basal cell of face, regular screenings with last appointment in September. Had lesion removed form chest. Yearly visits   No results found for: "PSA1", "PSA"   Immunization History  Administered Date(s) Administered   Influenza Inj Mdck Quad Pf 10/10/2016   PPD Test 05/17/2020  Flu vaccine - declines Declines covid vaccine.  Tdap - due - defers.   Vision Screening   Right eye Left eye Both eyes  Without correction 20/25 20/25 20/20   With correction     Readers only. Eval 2 years ago - no concerns.   Dental: every 6 months.   Alcohol: few per week.   Tobacco: none  Exercise: workouts with practice, coaching. Weight room few days per week. 2 miles per day walking.  Wt Readings from Last 3 Encounters:  01/17/24 233 lb (105.7  kg)  03/27/23 234 lb (106.1 kg)  03/06/23 235 lb (106.6 kg)     History Patient Active Problem List   Diagnosis Date Noted   Benign neoplasm of skin of lower limb 01/17/2024   History of malignant neoplasm of skin 01/17/2024   History of neoplasm 01/17/2024   Melanocytic nevus of trunk 01/17/2024   Seborrheic keratoses 01/17/2024   Annual physical exam 02/23/2019   Past Medical History:  Diagnosis Date   Eczema    GERD (gastroesophageal reflux disease)    Past Surgical History:  Procedure Laterality Date   LIPOMA EXCISION     on neck   WISDOM TOOTH EXTRACTION     No Known Allergies Prior to Admission medications   Medication Sig Start Date End Date Taking? Authorizing Provider  clobetasol ointment (TEMOVATE) 0.05 % as needed. 09/06/21  Yes [provider]  ketoconazole (NIZORAL) 2 % cream APPLY TO AFFECTED AREA TWICE A DAY 10/29/17  Yes [provider]   Social History   Socioeconomic History   Marital status: Married    Spouse name: Not on file   Number of children: Not on file   Years of education: Not on file   Highest education level: Not on file  Occupational History   Not on file  Tobacco Use   Smoking status: Never   Smokeless tobacco: Never  Vaping Use   Vaping status: Never Used  Substance and Sexual Activity   Alcohol use: Yes    Comment: occ   Drug use: Never   Sexual activity: Yes  Other Topics Concern   Not on file  Social History Narrative   Not on file   Social Drivers of Health   Financial Resource Strain: Not on file  Food Insecurity: Not on file  Transportation Needs: Not on file  Physical Activity: Not on file  Stress: Not on file  Social Connections: Not on file  Intimate Partner Violence: Not on file    Review of Systems 13 point review of systems per patient health survey noted.  Negative other than as indicated above or in HPI.    Objective:   Vitals:   01/17/24 0839  BP: 126/70  Pulse: 68  Resp: 16   Temp: 98 F (36.7 C)  TempSrc: Temporal  SpO2: 98%  Weight: 233 lb (105.7 kg)  Height: 5' 8.5" (1.74 m)     Physical Exam Vitals reviewed.  Constitutional:      Appearance: He is well-developed.  HENT:     Head: Normocephalic and atraumatic.     Right Ear: External ear  normal.     Left Ear: External ear normal.  Eyes:     Conjunctiva/sclera: Conjunctivae normal.     Pupils: Pupils are equal, round, and reactive to light.  Neck:     Thyroid: No thyromegaly.  Cardiovascular:     Rate and Rhythm: Normal rate and regular rhythm.     Heart sounds: Normal heart sounds.  Pulmonary:     Effort: Pulmonary effort is normal. No respiratory distress.     Breath sounds: Normal breath sounds. No wheezing.  Abdominal:     General: There is no distension.     Palpations: Abdomen is soft.     Tenderness: There is no abdominal tenderness.  Musculoskeletal:        General: No tenderness. Normal range of motion.     Cervical back: Normal range of motion and neck supple.  Lymphadenopathy:     Cervical: No cervical adenopathy.  Skin:    General: Skin is warm and dry.  Neurological:     Mental Status: He is alert and oriented to person, place, and time.     Deep Tendon Reflexes: Reflexes are normal and symmetric.  Psychiatric:        Behavior: Behavior normal.        Assessment & Plan:  Shawn Fuentes is a 48 y.o. male . Annual physical exam  Elevated LDL cholesterol level - Plan: Comprehensive metabolic panel with GFR, Lipid panel  Screening, anemia, deficiency, iron - Plan: CBC  Screening for diabetes mellitus - Plan: Hemoglobin A1c  Screening for thyroid disorder - Plan: TSH  -anticipatory guidance as below in AVS, screening labs above. Health maintenance items as above in HPI discussed/recommended as applicable.  -Borderline RBC on prior CBC, check labs as above.   -Borderline LDL elevation previously but low ASCVD risk score, no early family history of cardiac  disease, continue diet/exercise approach. - Intermittent heartburn, trigger avoidance discussed, over-the-counter options with RTC precautions discussed. 1 year follow-up for physical.  No orders of the defined types were placed in this encounter.  Patient Instructions  Thanks for coming in today.  If any concerns on labs I will let you know.  I am not concerned with the slight elevated LDL especially with a low 16-XWRU heart disease risk score.  Continue to stay active.  We can keep an eye on that level once a year.  Keep follow-up with dermatology as planned.  Over-the-counter Pepcid or omeprazole as needed for heartburn but let me know if you require this meds frequently.  Try to avoid the typical trigger foods, late-night eating, etc.  Let me know if there are any questions, otherwise I will see you in a year for a physical.  Have a great summer.  Preventive Care 47-62 Years Old, Male Preventive care refers to lifestyle choices and visits with your health care provider that can promote health and wellness. Preventive care visits are also called wellness exams. What can I expect for my preventive care visit? Counseling During your preventive care visit, your health care provider may ask about your: Medical history, including: Past medical problems. Family medical history. Current health, including: Emotional well-being. Home life and relationship well-being. Sexual activity. Lifestyle, including: Alcohol, nicotine or tobacco, and drug use. Access to firearms. Diet, exercise, and sleep habits. Safety issues such as seatbelt and bike helmet use. Sunscreen use. Work and work Astronomer. Physical exam Your health care provider will check your: Height and weight. These may be used to calculate your BMI (  body mass index). BMI is a measurement that tells if you are at a healthy weight. Waist circumference. This measures the distance around your waistline. This measurement also tells  if you are at a healthy weight and may help predict your risk of certain diseases, such as type 2 diabetes and high blood pressure. Heart rate and blood pressure. Body temperature. Skin for abnormal spots. What immunizations do I need?  Vaccines are usually given at various ages, according to a schedule. Your health care provider will recommend vaccines for you based on your age, medical history, and lifestyle or other factors, such as travel or where you work. What tests do I need? Screening Your health care provider may recommend screening tests for certain conditions. This may include: Lipid and cholesterol levels. Diabetes screening. This is done by checking your blood sugar (glucose) after you have not eaten for a while (fasting). Hepatitis B test. Hepatitis C test. HIV (human immunodeficiency virus) test. STI (sexually transmitted infection) testing, if you are at risk. Lung cancer screening. Prostate cancer screening. Colorectal cancer screening. Talk with your health care provider about your test results, treatment options, and if necessary, the need for more tests. Follow these instructions at home: Eating and drinking  Eat a diet that includes fresh fruits and vegetables, whole grains, lean protein, and low-fat dairy products. Take vitamin and mineral supplements as recommended by your health care provider. Do not drink alcohol if your health care provider tells you not to drink. If you drink alcohol: Limit how much you have to 0-2 drinks a day. Know how much alcohol is in your drink. In the [place] one drink equals one 12 oz bottle of beer (355 mL), one 5 oz glass of wine (148 mL), or one 1 oz glass of hard liquor (44 mL). Lifestyle Brush your teeth every morning and night with fluoride toothpaste. Floss one time each day. Exercise for at least 30 minutes 5 or more days each week. Do not use any products that contain nicotine or tobacco. These products include cigarettes,  chewing tobacco, and vaping devices, such as e-cigarettes. If you need help quitting, ask your health care provider. Do not use drugs. If you are sexually active, practice safe sex. Use a condom or other form of protection to prevent STIs. Take aspirin only as told by your health care provider. Make sure that you understand how much to take and what form to take. Work with your health care provider to find out whether it is safe and beneficial for you to take aspirin daily. Find healthy ways to manage stress, such as: Meditation, yoga, or listening to music. Journaling. Talking to a trusted person. Spending time with friends and family. Minimize exposure to UV radiation to reduce your risk of skin cancer. Safety Always wear your seat belt while driving or riding in a vehicle. Do not drive: If you have been drinking alcohol. Do not ride with someone who has been drinking. When you are tired or distracted. While texting. If you have been using any mind-altering substances or drugs. Wear a helmet and other protective equipment during sports activities. If you have firearms in your house, make sure you follow all gun safety procedures. What's next? Go to your health care provider once a year for an annual wellness visit. Ask your health care provider how often you should have your eyes and teeth checked. Stay up to date on all vaccines. This information is not intended to replace advice given to you  by your health care provider. Make sure you discuss any questions you have with your health care provider. Document Revised: 02/08/2021 Document Reviewed: 02/08/2021 Elsevier Patient Education  2024 [employer]    Signed,   Caro Christmas, MD  Primary Care, [facility] 01/17/24 9:34 AM

## 2024-01-21 ENCOUNTER — Ambulatory Visit: Payer: Self-pay | Admitting: Family Medicine

## 2024-02-07 ENCOUNTER — Encounter: Payer: 59 | Admitting: Family Medicine

## 2025-01-21 ENCOUNTER — Encounter: Admitting: Family Medicine
